# Patient Record
Sex: Female | Born: 2000 | Race: White | Hispanic: No | Smoking: Never smoker
Health system: Southern US, Community
[De-identification: ages and names within clinical notes are randomized; demographics above are authoritative.]

## PROBLEM LIST (undated history)

## (undated) DIAGNOSIS — Z9109 Other allergy status, other than to drugs and biological substances: Secondary | ICD-10-CM

## (undated) DIAGNOSIS — F988 Other specified behavioral and emotional disorders with onset usually occurring in childhood and adolescence: Secondary | ICD-10-CM

## (undated) HISTORY — DX: Other specified behavioral and emotional disorders with onset usually occurring in childhood and adolescence: F98.8

## (undated) HISTORY — DX: Other allergy status, other than to drugs and biological substances: Z91.09

---
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
## 2019-01-01 ENCOUNTER — Ambulatory Visit (INDEPENDENT_AMBULATORY_CARE_PROVIDER_SITE_OTHER): Payer: Medicaid Other | Admitting: Nurse Practitioner

## 2019-01-01 ENCOUNTER — Encounter: Payer: Self-pay | Admitting: Nurse Practitioner

## 2019-01-01 ENCOUNTER — Other Ambulatory Visit: Payer: Self-pay

## 2019-01-01 VITALS — BP 131/77 | HR 94 | Temp 98.0°F | Resp 16 | Ht 62.0 in | Wt 164.0 lb

## 2019-01-01 DIAGNOSIS — Z7251 High risk heterosexual behavior: Secondary | ICD-10-CM

## 2019-01-01 NOTE — Progress Notes (Signed)
   Subjective:    Patient ID: Amber Hurst, female    DOB: [birth year], 18 y.o.   MRN: [id number]   Chief Complaint: Wants STD testing   HPI Patient come sin today wanting STD screening. She states that she recently had a new sex partner and did not use protection. He tested positive gonorrhea but he had slept with someone else several days after he slept with her. Patient denies any symptoms.   Review of Systems  Constitutional: Negative for activity change and appetite change.  HENT: Negative.   Eyes: Negative for pain.  Respiratory: Negative for shortness of breath.   Cardiovascular: Negative for chest pain, palpitations and leg swelling.  Gastrointestinal: Negative for abdominal pain.  Endocrine: Negative for polydipsia.  Genitourinary: Negative.   Skin: Negative for rash.  Neurological: Negative for dizziness, weakness and headaches.  Hematological: Does not bruise/bleed easily.  Psychiatric/Behavioral: Negative.   All other systems reviewed and are negative.      Objective:   Physical Exam Vitals signs and nursing note reviewed.  Constitutional:      General: She is not in acute distress.    Appearance: Normal appearance. She is well-developed.  HENT:     Head: Normocephalic.     Nose: Nose normal.  Eyes:     Pupils: Pupils are equal, round, and reactive to light.  Neck:     Musculoskeletal: Normal range of motion and neck supple.     Vascular: No carotid bruit or JVD.  Cardiovascular:     Rate and Rhythm: Normal rate and regular rhythm.     Heart sounds: Normal heart sounds.  Pulmonary:     Effort: Pulmonary effort is normal. No respiratory distress.     Breath sounds: Normal breath sounds. No wheezing or rales.  Chest:     Chest wall: No tenderness.  Abdominal:     General: Bowel sounds are normal. There is no distension or abdominal bruit.     Palpations: Abdomen is soft. There is no hepatomegaly, splenomegaly, mass or pulsatile mass.     Tenderness: There is  no abdominal tenderness.  Genitourinary:    Comments: No pelvic exam done Musculoskeletal: Normal range of motion.  Lymphadenopathy:     Cervical: No cervical adenopathy.  Skin:    General: Skin is warm and dry.  Neurological:     Mental Status: She is alert and oriented to person, place, and time.     Deep Tendon Reflexes: Reflexes are normal and symmetric.  Psychiatric:        Behavior: Behavior normal.        Thought Content: Thought content normal.        Judgment: Judgment normal.     BP 131/77   Pulse 94   Temp 98 F (36.7 C) (Oral)   Resp 16   Ht 5\' 2"  (1.575 m)   Wt 164 lb (74.4 kg)   SpO2 97%   BMI 30.00 kg/m        Assessment & Plan:  Amber Hurst in today with chief complaint of Wants STD testing   1. High risk heterosexual behavior Safe sex discussed - Chlamydia/Gonococcus/Trichomonas, NAA - STD Screen (8) - HIV antibody (with reflex)  Mary-Margaret Hassell Done, FNP

## 2019-01-01 NOTE — Patient Instructions (Signed)

## 2019-01-02 LAB — STD SCREEN (8)
HIV Screen 4th Generation wRfx: NONREACTIVE
HSV 1 Glycoprotein G Ab, IgG: <0.91 index (ref 0.00–0.90)
HSV 2 IgG, Type Spec: 20.4 index — ABNORMAL HIGH (ref 0.00–0.90)
Hep A IgM: NEGATIVE
Hep B C IgM: NEGATIVE
Hep C Virus Ab: <0.1 s/co ratio (ref 0.0–0.9)
Hepatitis B Surface Ag: NEGATIVE
RPR Ser Ql: NONREACTIVE

## 2019-01-04 LAB — CHLAMYDIA/GONOCOCCUS/TRICHOMONAS, NAA
Chlamydia by NAA: POSITIVE — AB
Gonococcus by NAA: POSITIVE — AB
Trich vag by NAA: NEGATIVE

## 2019-01-05 ENCOUNTER — Ambulatory Visit (INDEPENDENT_AMBULATORY_CARE_PROVIDER_SITE_OTHER): Payer: Medicaid Other

## 2019-01-05 ENCOUNTER — Other Ambulatory Visit: Payer: Self-pay

## 2019-01-05 ENCOUNTER — Other Ambulatory Visit: Payer: Self-pay | Admitting: Nurse Practitioner

## 2019-01-05 DIAGNOSIS — Z7251 High risk heterosexual behavior: Secondary | ICD-10-CM | POA: Diagnosis not present

## 2019-01-05 DIAGNOSIS — A549 Gonococcal infection, unspecified: Secondary | ICD-10-CM

## 2019-01-05 MED ORDER — AZITHROMYCIN 250 MG PO TABS: ORAL_TABLET | ORAL | 0 refills | Status: DC

## 2019-01-05 MED ORDER — CEFTRIAXONE SODIUM 1 G IJ SOLR
1.0000 g | Freq: Once | INTRAMUSCULAR | Status: AC
  Administered 2019-01-05: 11:00:00 1 g via INTRAMUSCULAR

## 2019-01-05 NOTE — Progress Notes (Signed)
Rocephin 1 gram given to right upper outer quadrant.  Patient tolerated well. 

## 2019-01-05 NOTE — Progress Notes (Signed)
zithromax

## 2019-02-11 ENCOUNTER — Other Ambulatory Visit: Payer: Self-pay

## 2019-02-12 ENCOUNTER — Encounter: Payer: Self-pay | Admitting: Nurse Practitioner

## 2019-02-12 ENCOUNTER — Ambulatory Visit (INDEPENDENT_AMBULATORY_CARE_PROVIDER_SITE_OTHER): Payer: Medicaid Other | Admitting: Nurse Practitioner

## 2019-02-12 ENCOUNTER — Other Ambulatory Visit: Payer: Self-pay

## 2019-02-12 VITALS — BP 116/68 | HR 110 | Temp 98.7°F | Ht 62.0 in | Wt 167.0 lb

## 2019-02-12 DIAGNOSIS — Z309 Encounter for contraceptive management, unspecified: Secondary | ICD-10-CM | POA: Diagnosis not present

## 2019-02-12 LAB — PREGNANCY, URINE: Preg Test, Ur: NEGATIVE

## 2019-02-12 MED ORDER — MEDROXYPROGESTERONE ACETATE 150 MG/ML IM SUSP: 150.0000 mg | INTRAMUSCULAR | 11 refills | Status: DC

## 2019-02-12 NOTE — Patient Instructions (Signed)
Medroxyprogesterone injection [Contraceptive] What is this medicine? MEDROXYPROGESTERONE (me DROX ee proe JES te rone) contraceptive injections prevent pregnancy. They provide effective birth control for 3 months. Depo-subQ Provera 104 is also used for treating pain related to endometriosis. This medicine may be used for other purposes; ask your health care provider or pharmacist if you have questions. COMMON BRAND NAME(S): Depo-Provera, Depo-subQ Provera 104 What should I tell my health care provider before I take this medicine? They need to know if you have any of these conditions:  frequently drink alcohol  asthma  blood vessel disease or a history of a blood clot in the lungs or legs  bone disease such as osteoporosis  breast cancer  diabetes  eating disorder (anorexia nervosa or bulimia)  high blood pressure  HIV infection or AIDS  kidney disease  liver disease  mental depression  migraine  seizures (convulsions)  stroke  tobacco smoker  vaginal bleeding  an unusual or allergic reaction to medroxyprogesterone, other hormones, medicines, foods, dyes, or preservatives  pregnant or trying to get pregnant  breast-feeding How should I use this medicine? Depo-Provera Contraceptive injection is given into a muscle. Depo-subQ Provera 104 injection is given under the skin. These injections are given by a health care professional. You must not be pregnant before getting an injection. The injection is usually given during the first 5 days after the start of a menstrual period or 6 weeks after delivery of a baby. Talk to your pediatrician regarding the use of this medicine in children. Special care may be needed. These injections have been used in female children who have started having menstrual periods. Overdosage: If you think you have taken too much of this medicine contact a poison control center or emergency room at once. NOTE: This medicine is only for you. Do not  share this medicine with others. What if I miss a dose? Try not to miss a dose. You must get an injection once every 3 months to maintain birth control. If you cannot keep an appointment, call and reschedule it. If you wait longer than 13 weeks between Depo-Provera contraceptive injections or longer than 14 weeks between Depo-subQ Provera 104 injections, you could get pregnant. Use another method for birth control if you miss your appointment. You may also need a pregnancy test before receiving another injection. What may interact with this medicine? Do not take this medicine with any of the following medications:  bosentan This medicine may also interact with the following medications:  aminoglutethimide  antibiotics or medicines for infections, especially rifampin, rifabutin, rifapentine, and griseofulvin  aprepitant  barbiturate medicines such as phenobarbital or primidone  bexarotene  carbamazepine  medicines for seizures like ethotoin, felbamate, oxcarbazepine, phenytoin, topiramate  modafinil  St. John's wort This list may not describe all possible interactions. Give your health care provider a list of all the medicines, herbs, non-prescription drugs, or dietary supplements you use. Also tell them if you smoke, drink alcohol, or use illegal drugs. Some items may interact with your medicine. What should I watch for while using this medicine? This drug does not protect you against HIV infection (AIDS) or other sexually transmitted diseases. Use of this product may cause you to lose calcium from your bones. Loss of calcium may cause weak bones (osteoporosis). Only use this product for more than 2 years if other forms of birth control are not right for you. The longer you use this product for birth control the more likely you will be at risk   for weak bones. Ask your health care professional how you can keep strong bones. You may have a change in bleeding pattern or irregular periods.  Many females stop having periods while taking this drug. If you have received your injections on time, your chance of being pregnant is very low. If you think you may be pregnant, see your health care professional as soon as possible. Tell your health care professional if you want to get pregnant within the next year. The effect of this medicine may last a long time after you get your last injection. What side effects may I notice from receiving this medicine? Side effects that you should report to your doctor or health care professional as soon as possible:  allergic reactions like skin rash, itching or hives, swelling of the face, lips, or tongue  breast tenderness or discharge  breathing problems  changes in vision  depression  feeling faint or lightheaded, falls  fever  pain in the abdomen, chest, groin, or leg  problems with balance, talking, walking  unusually weak or tired  yellowing of the eyes or skin Side effects that usually do not require medical attention (report to your doctor or health care professional if they continue or are bothersome):  acne  fluid retention and swelling  headache  irregular periods, spotting, or absent periods  temporary pain, itching, or skin reaction at site where injected  weight gain This list may not describe all possible side effects. Call your doctor for medical advice about side effects. You may report side effects to FDA at 1-800-FDA-1088. Where should I keep my medicine? This does not apply. The injection will be given to you by a health care professional. NOTE: This sheet is a summary. It may not cover all possible information. If you have questions about this medicine, talk to your doctor, pharmacist, or health care provider.  2020 Elsevier/Gold Standard (2008-04-08 18:37:56)  

## 2019-02-12 NOTE — Progress Notes (Signed)
   Subjective:    Patient ID: Amber Hurst, female    DOB: 08-19-2000, 18 y.o.   MRN: [id number]  Patient comes in today wanting to go back depoprovera shots. She was on it for 3 years but stopped taking. She is now sexually active. LMP 01/30/19 and was normal. Has no complaints.   Review of Systems  Constitutional: Negative for activity change and appetite change.  HENT: Negative.   Eyes: Negative for pain.  Respiratory: Negative for shortness of breath.   Cardiovascular: Negative for chest pain, palpitations and leg swelling.  Gastrointestinal: Negative for abdominal pain.  Endocrine: Negative for polydipsia.  Genitourinary: Negative.   Skin: Negative for rash.  Neurological: Negative for dizziness, weakness and headaches.  Hematological: Does not bruise/bleed easily.  Psychiatric/Behavioral: Negative.   All other systems reviewed and are negative.      Objective:   Physical Exam Vitals signs and nursing note reviewed.  Constitutional:      General: She is not in acute distress.    Appearance: Normal appearance. She is well-developed.  HENT:     Head: Normocephalic.     Nose: Nose normal.  Eyes:     Pupils: Pupils are equal, round, and reactive to light.  Neck:     Musculoskeletal: Normal range of motion and neck supple.     Vascular: No carotid bruit or JVD.  Cardiovascular:     Rate and Rhythm: Normal rate and regular rhythm.     Heart sounds: Normal heart sounds.  Pulmonary:     Effort: Pulmonary effort is normal. No respiratory distress.     Breath sounds: Normal breath sounds. No wheezing or rales.  Chest:     Chest wall: No tenderness.  Abdominal:     General: Bowel sounds are normal. There is no distension or abdominal bruit.     Palpations: Abdomen is soft. There is no hepatomegaly, splenomegaly, mass or pulsatile mass.     Tenderness: There is no abdominal tenderness.  Musculoskeletal: Normal range of motion.  Lymphadenopathy:     Cervical: No cervical  adenopathy.  Skin:    General: Skin is warm and dry.  Neurological:     Mental Status: She is alert and oriented to person, place, and time.     Deep Tendon Reflexes: Reflexes are normal and symmetric.  Psychiatric:        Behavior: Behavior normal.        Thought Content: Thought content normal.        Judgment: Judgment normal.    BP 116/68   Pulse (!) 110   Temp 98.7 F (37.1 C) (Temporal)   Ht 5\' 2"  (1.575 m)   Wt 167 lb (75.8 kg)   SpO2 99%   BMI 30.54 kg/m   Pregnancy negative       Assessment & Plan:  Amber Hurst in today with chief complaint of Contraception (begin depo provera again)   1. Encounter for contraceptive management, unspecified type Reviewed side effects - Pregnancy, urine - medroxyPROGESTERone (DEPO-PROVERA) 150 MG/ML injection; Inject 1 mL (150 mg total) into the muscle every 3 (three) months.  Dispense: 1 mL; Refill: [place], FNP

## 2019-02-22 ENCOUNTER — Encounter: Payer: Self-pay | Admitting: Family

## 2019-02-22 ENCOUNTER — Ambulatory Visit (INDEPENDENT_AMBULATORY_CARE_PROVIDER_SITE_OTHER): Payer: Medicaid Other | Admitting: Family

## 2019-02-22 ENCOUNTER — Other Ambulatory Visit: Payer: Self-pay

## 2019-02-22 ENCOUNTER — Ambulatory Visit: Payer: Medicaid Other

## 2019-02-22 DIAGNOSIS — J019 Acute sinusitis, unspecified: Secondary | ICD-10-CM | POA: Diagnosis not present

## 2019-02-22 MED ORDER — AMOXICILLIN-POT CLAVULANATE 875-125 MG PO TABS: 1.0000 | ORAL_TABLET | Freq: Two times a day (BID) | ORAL | 0 refills | Status: DC

## 2019-02-22 NOTE — Progress Notes (Signed)
   Virtual Visit via telephone Note Due to COVID-19 pandemic this visit was conducted virtually. This visit type was conducted due to national recommendations for restrictions regarding the COVID-19 Pandemic (e.g. social distancing, sheltering in place) in an effort to limit this patient's exposure and mitigate transmission in our community. All issues noted in this document were discussed and addressed.  A physical exam was not performed with this format.  I connected with Amber Hurst on 02/22/19 at 2:28 pm  by telephone and verified that I am speaking with the correct person using two identifiers. Amber Hurst is currently located in the car and  is currently with no one during visit. The provider, Evelina Dun, FNP is located in their office at time of visit.  I discussed the limitations, risks, security and privacy concerns of performing an evaluation and management service by telephone and the availability of in person appointments. I also discussed with the patient that there may be a patient responsible charge related to this service. The patient expressed understanding and agreed to proceed.   History and Present Illness:  Sinusitis This is a new problem. The current episode started in the past 7 days. The problem has been gradually worsening since onset. There has been no fever. Her pain is at a severity of 9/10. The pain is mild. Associated symptoms include congestion, coughing, headaches, a hoarse voice, sinus pressure and sneezing. Pertinent negatives include no chills, ear pain or sore throat. Past treatments include acetaminophen and oral decongestants. The treatment provided mild relief.      Review of Systems  Constitutional: Negative for chills.  HENT: Positive for congestion, hoarse voice, sinus pressure and sneezing. Negative for ear pain and sore throat.   Respiratory: Positive for cough.   Neurological: Positive for headaches.  All other systems reviewed and are negative.    Observations/Objective: No SOB or distress noted, hoarse voice  Assessment and Plan: 1. Acute sinusitis, recurrence not specified, unspecified location Pt will go get COVID tested tomorrow  Work note given - Take meds as prescribed - Use a cool mist humidifier  -Use saline nose sprays frequently -Force fluids -For any cough or congestion  Use plain Mucinex- regular strength or max strength is fine -For fever or aces or pains- take tylenol or ibuprofen. -Throat lozenges if help -Call if symptoms worsen or do not improve  - amoxicillin-clavulanate (AUGMENTIN) 875-125 MG tablet; Take 1 tablet by mouth 2 (two) times daily.  Dispense: 14 tablet; Refill: 0      I discussed the assessment and treatment plan with the patient. The patient was provided an opportunity to ask questions and all were answered. The patient agreed with the plan and demonstrated an understanding of the instructions.   The patient was advised to call back or seek an in-person evaluation if the symptoms worsen or if the condition fails to improve as anticipated.  The above assessment and management plan was discussed with the patient. The patient verbalized understanding of and has agreed to the management plan. Patient is aware to call the clinic if symptoms persist or worsen. Patient is aware when to return to the clinic for a follow-up visit. Patient educated on when it is appropriate to go to the emergency department.   Time call ended:  2:38 pm  I provided 10 minutes of non-face-to-face time during this encounter.    Evelina Dun, FNP

## 2019-03-02 ENCOUNTER — Other Ambulatory Visit: Payer: Self-pay

## 2019-03-03 ENCOUNTER — Ambulatory Visit (INDEPENDENT_AMBULATORY_CARE_PROVIDER_SITE_OTHER): Payer: Medicaid Other

## 2019-03-03 DIAGNOSIS — Z3042 Encounter for surveillance of injectable contraceptive: Secondary | ICD-10-CM | POA: Diagnosis not present

## 2019-03-03 DIAGNOSIS — Z30013 Encounter for initial prescription of injectable contraceptive: Secondary | ICD-10-CM

## 2019-03-03 NOTE — Progress Notes (Signed)
Medroxyprogesterone injection given to right upper outer quadrant.  Patient tolerated well. 

## 2019-03-07 IMAGING — DX DG KNEE 1-2V*R*
2 series · 2 of 2 positions shown · non-contrast
Comparison: 04/23/2016

CLINICAL DATA: Chronic patellofemoral pain of the right knee.

EXAM:
RIGHT KNEE - 1-2 VIEW

[knee ap]
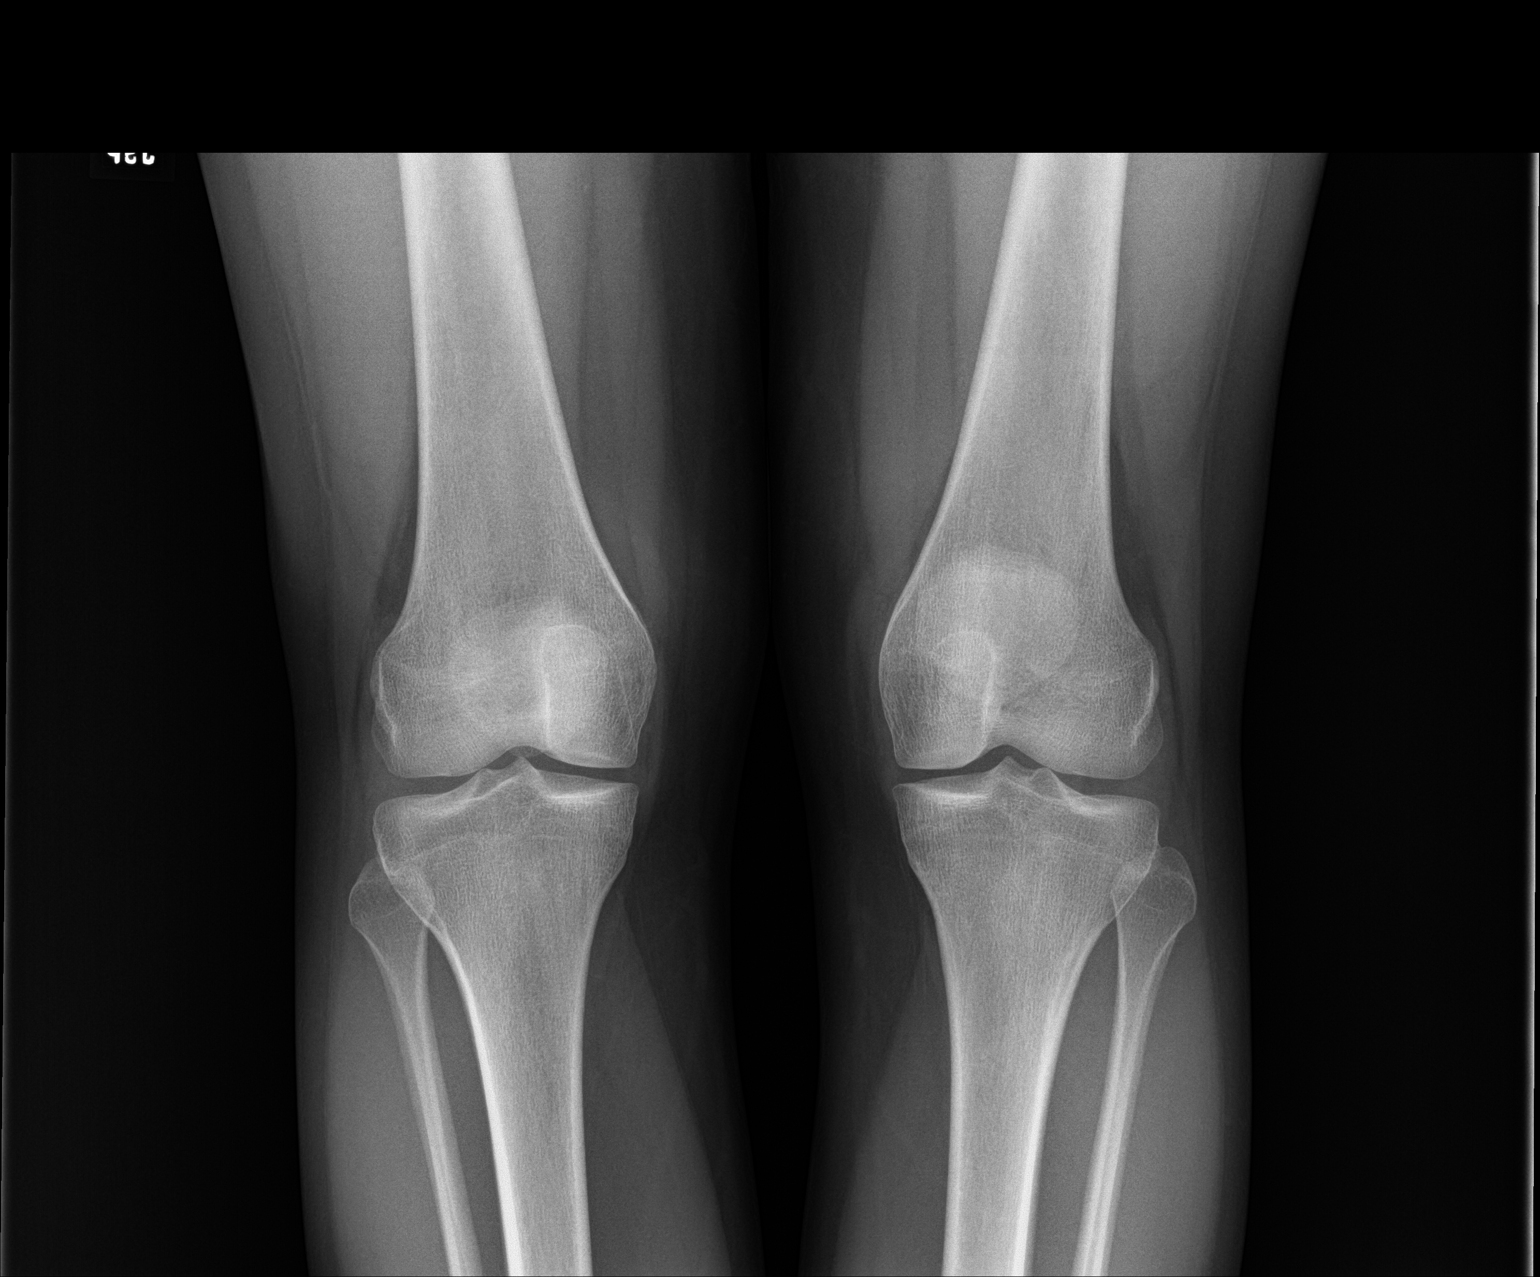

[knee lat]
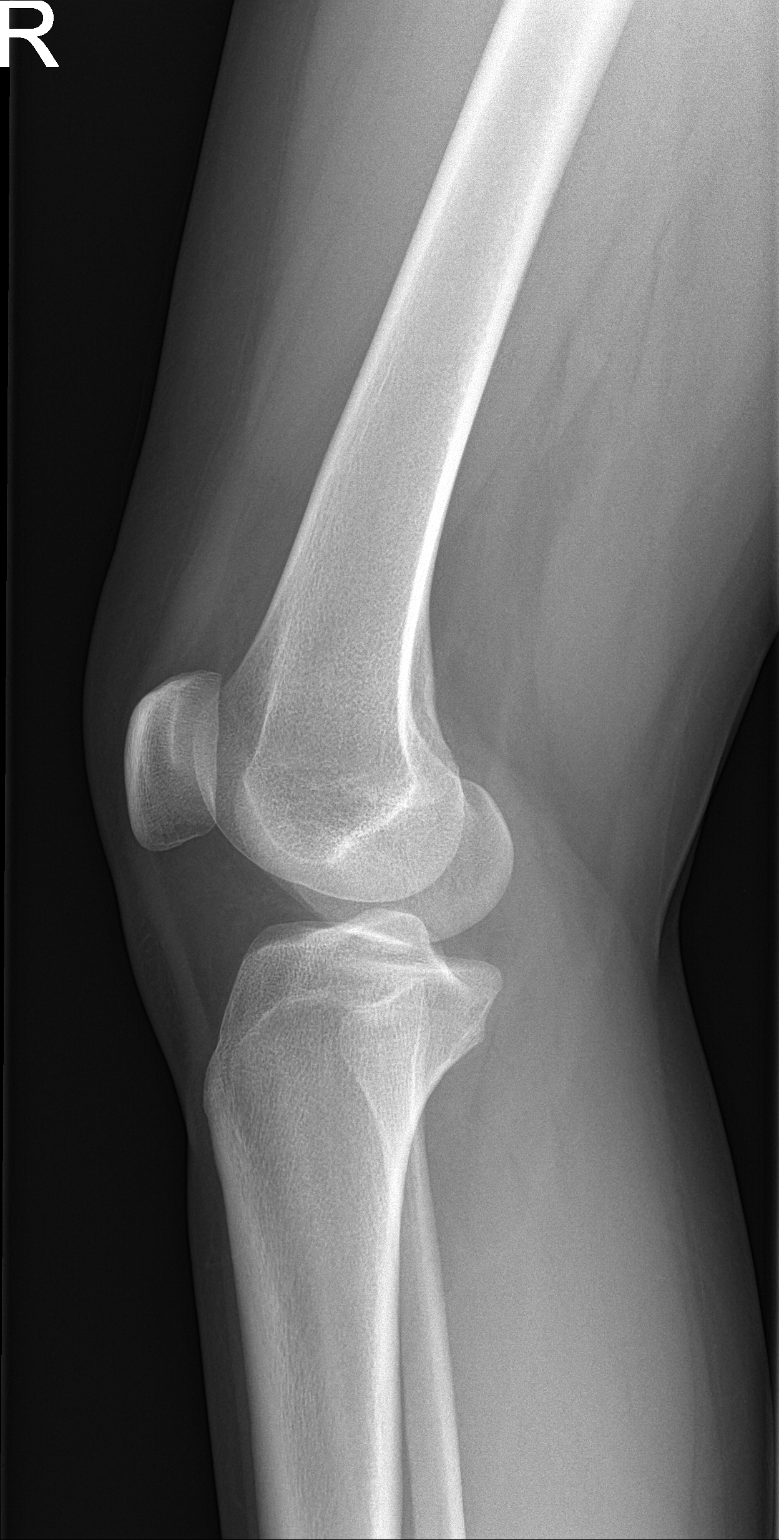

[2 of 2 positions shown; findings below may reference images not displayed]

FINDINGS: No evidence of fracture, dislocation, or joint effusion. No evidence
of arthropathy or other focal bone abnormality. Soft tissues are
unremarkable.
IMPRESSION: Negative.

## 2019-05-20 ENCOUNTER — Ambulatory Visit: Payer: Medicaid Other

## 2019-05-21 ENCOUNTER — Other Ambulatory Visit: Payer: Self-pay

## 2019-05-21 ENCOUNTER — Ambulatory Visit: Payer: Medicaid Other

## 2019-05-24 ENCOUNTER — Ambulatory Visit (INDEPENDENT_AMBULATORY_CARE_PROVIDER_SITE_OTHER): Payer: Medicaid Other

## 2019-05-24 ENCOUNTER — Other Ambulatory Visit: Payer: Self-pay

## 2019-05-24 DIAGNOSIS — Z3042 Encounter for surveillance of injectable contraceptive: Secondary | ICD-10-CM | POA: Diagnosis not present

## 2019-05-24 DIAGNOSIS — Z309 Encounter for contraceptive management, unspecified: Secondary | ICD-10-CM

## 2019-05-24 NOTE — Progress Notes (Signed)
Medroxyprogesterone injection given to left upper outer quadrant.  Patient tolerated well. 

## 2019-08-09 ENCOUNTER — Ambulatory Visit: Payer: Medicaid Other

## 2019-08-10 ENCOUNTER — Telehealth: Payer: Self-pay | Admitting: Nurse Practitioner

## 2019-08-10 ENCOUNTER — Ambulatory Visit: Payer: Medicaid Other

## 2019-08-10 NOTE — Telephone Encounter (Signed)
appt made for 08/18/19

## 2019-08-18 ENCOUNTER — Ambulatory Visit (INDEPENDENT_AMBULATORY_CARE_PROVIDER_SITE_OTHER): Payer: Medicaid Other | Admitting: Family Medicine

## 2019-08-18 ENCOUNTER — Other Ambulatory Visit: Payer: Self-pay

## 2019-08-18 DIAGNOSIS — Z3042 Encounter for surveillance of injectable contraceptive: Secondary | ICD-10-CM | POA: Diagnosis not present

## 2019-08-18 DIAGNOSIS — Z309 Encounter for contraceptive management, unspecified: Secondary | ICD-10-CM

## 2019-08-18 MED ORDER — MEDROXYPROGESTERONE ACETATE 150 MG/ML IM SUSP
150.0000 mg | Freq: Once | INTRAMUSCULAR | Status: AC
  Administered 2019-08-18: 150 mg via INTRAMUSCULAR

## 2019-09-30 DIAGNOSIS — Z419 Encounter for procedure for purposes other than remedying health state, unspecified: Secondary | ICD-10-CM | POA: Diagnosis not present

## 2019-10-31 DIAGNOSIS — Z419 Encounter for procedure for purposes other than remedying health state, unspecified: Secondary | ICD-10-CM | POA: Diagnosis not present

## 2019-11-05 ENCOUNTER — Telehealth: Payer: Self-pay | Admitting: Nurse Practitioner

## 2019-11-05 NOTE — Telephone Encounter (Signed)
Pt needs refill on her depo but pt also states she was told she could administer the Depo Provera on her own at home. I didn't see this documented anywhere in her chart so I wanted to verify this prior to sending rx in to the pharmacy. Please advise.

## 2019-11-05 NOTE — Telephone Encounter (Signed)
No she cannot do depo on her own. Has to be given at doctors office

## 2019-11-05 NOTE — Telephone Encounter (Signed)
  Prescription Request  11/05/2019  What is the name of the medication or equipment? medroxyPROGESTERone (DEPO-PROVERA) 150 MG/ML injection  Have you contacted your pharmacy to request a refill? (if applicable) yes  Which pharmacy would you like this sent to? [facility]   Patient notified that their request is being sent to the clinical staff for review and that they should receive a response within 2 business days.

## 2019-11-08 NOTE — Telephone Encounter (Signed)
Pt aware and scheduled for 11/09/19 at 3:30 with the nurse.

## 2019-11-09 ENCOUNTER — Other Ambulatory Visit: Payer: Self-pay

## 2019-11-09 ENCOUNTER — Ambulatory Visit (INDEPENDENT_AMBULATORY_CARE_PROVIDER_SITE_OTHER): Payer: Medicaid Other

## 2019-11-09 DIAGNOSIS — Z309 Encounter for contraceptive management, unspecified: Secondary | ICD-10-CM

## 2019-11-09 DIAGNOSIS — Z3042 Encounter for surveillance of injectable contraceptive: Secondary | ICD-10-CM

## 2019-11-09 NOTE — Progress Notes (Signed)
Medroxyprogesterone injection given to left upper outer quadrant.  Patient tolerated well. 

## 2019-12-01 DIAGNOSIS — Z419 Encounter for procedure for purposes other than remedying health state, unspecified: Secondary | ICD-10-CM | POA: Diagnosis not present

## 2019-12-31 DIAGNOSIS — Z419 Encounter for procedure for purposes other than remedying health state, unspecified: Secondary | ICD-10-CM | POA: Diagnosis not present

## 2020-01-26 ENCOUNTER — Ambulatory Visit: Payer: Medicaid Other

## 2020-01-31 DIAGNOSIS — Z419 Encounter for procedure for purposes other than remedying health state, unspecified: Secondary | ICD-10-CM | POA: Diagnosis not present

## 2020-02-07 ENCOUNTER — Encounter: Payer: Medicaid Other | Admitting: Nurse Practitioner

## 2020-02-07 ENCOUNTER — Encounter: Payer: Self-pay | Admitting: Nurse Practitioner

## 2020-02-29 ENCOUNTER — Ambulatory Visit
Admission: EM | Admit: 2020-02-29 | Discharge: 2020-02-29 | Disposition: A | Discharge status: Home / Self Care | Payer: Medicaid Other | Attending: Emergency Medicine | Admitting: Emergency Medicine

## 2020-02-29 ENCOUNTER — Other Ambulatory Visit: Payer: Self-pay

## 2020-02-29 ENCOUNTER — Encounter: Payer: Self-pay | Admitting: Emergency Medicine

## 2020-02-29 DIAGNOSIS — J069 Acute upper respiratory infection, unspecified: Secondary | ICD-10-CM

## 2020-02-29 MED ORDER — FLUTICASONE PROPIONATE 50 MCG/ACT NA SUSP: 1.0000 | Freq: Every day | NASAL | 0 refills | Status: DC

## 2020-02-29 MED ORDER — BENZONATATE 100 MG PO CAPS: 100.0000 mg | ORAL_CAPSULE | Freq: Three times a day (TID) | ORAL | 0 refills | Status: DC

## 2020-02-29 MED ORDER — CETIRIZINE HCL 10 MG PO TABS: 10.0000 mg | ORAL_TABLET | Freq: Every day | ORAL | 0 refills | Status: DC

## 2020-02-29 NOTE — ED Provider Notes (Addendum)
[facility]   [id number] 02/29/20 Arrival Time: 1853   CC: COVID symptoms  SUBJECTIVE: History from: patient.  Amber Hurst is a 19 y.o. female who presented to the urgent care for complaint of cough, sneezing and congestion for the past 3 days.  Denies sick exposure to COVID, flu or strep.  Denies recent travel.  Has tried OTC medication without relief.  Denies aggravating factors.  Denies previous symptoms in the past.   Denies fever, chills, fatigue, sinus pain, rhinorrhea, sore throat, SOB, wheezing, chest pain, nausea, changes in bowel or bladder habits.    ROS: As per HPI.  All other pertinent ROS negative.      Past Medical History:  Diagnosis Date  . ADD (attention deficit disorder)   . Environmental allergies    respiratory    History reviewed. No pertinent surgical history. Allergies  Allergen Reactions  . Chocolate Flavor     Rash with itching   Current Facility-Administered Medications on File Prior to Encounter  Medication Dose Route Frequency Provider Last Rate Last Admin  . medroxyPROGESTERone (DEPO-PROVERA) injection 150 mg  150 mg Intramuscular Q90 days Bennie Pierini, FNP   150 mg at 11/09/19 1528   Current Outpatient Medications on File Prior to Encounter  Medication Sig Dispense Refill  . amoxicillin-clavulanate (AUGMENTIN) 875-125 MG tablet Take 1 tablet by mouth 2 (two) times daily. 14 tablet 0  . medroxyPROGESTERone (DEPO-PROVERA) 150 MG/ML injection Inject 1 mL (150 mg total) into the muscle every 3 (three) months. 1 mL 11   Social History   Socioeconomic History  . Marital status: Single    Spouse name: Not on file  . Number of children: Not on file  . Years of education: Not on file  . Highest education level: Not on file  Occupational History  . Not on file  Tobacco Use  . Smoking status: Never Smoker  . Smokeless tobacco: Never Used  Vaping Use  . Vaping Use: Every day  Substance and Sexual Activity  . Alcohol use: No    . Drug use: No  . Sexual activity: Yes  Other Topics Concern  . Not on file  Social History Narrative  . Not on file   Social Determinants of Health   Financial Resource Strain:   . Difficulty of Paying Living Expenses: Not on file  Food Insecurity:   . Worried About Programme researcher, broadcasting/film/video in the Last Year: Not on file  . Ran Out of Food in the Last Year: Not on file  Transportation Needs:   . Lack of Transportation (Medical): Not on file  . Lack of Transportation (Non-Medical): Not on file  Physical Activity:   . Days of Exercise per Week: Not on file  . Minutes of Exercise per Session: Not on file  Stress:   . Feeling of Stress : Not on file  Social Connections:   . Frequency of Communication with Friends and Family: Not on file  . Frequency of Social Gatherings with Friends and Family: Not on file  . Attends Religious Services: Not on file  . Active Member of Clubs or Organizations: Not on file  . Attends Banker Meetings: Not on file  . Marital Status: Not on file  Intimate Partner Violence:   . Fear of Current or Ex-Partner: Not on file  . Emotionally Abused: Not on file  . Physically Abused: Not on file  . Sexually Abused: Not on file   No family history on file.  OBJECTIVE:  Vitals:   02/29/20 1908 02/29/20 1909  BP:  116/75  Pulse:  (!) 101  Resp:  19  Temp:  98.4 F (36.9 C)  TempSrc:  Oral  SpO2:  98%  Weight: 178 lb (80.7 kg)   Height: 5\' 2"  (1.575 m)      General appearance: alert; appears fatigued, but nontoxic; speaking in full sentences and tolerating own secretions HEENT: NCAT; Ears: EACs clear, TMs pearly gray; Eyes: PERRL.  EOM grossly intact. Sinuses: nontender; Nose: nares patent without rhinorrhea, Throat: oropharynx clear, tonsils non erythematous or enlarged, uvula midline  Neck: supple without LAD Lungs: unlabored respirations, symmetrical air entry; cough: moderate; no respiratory distress; CTAB Heart: regular rate and  rhythm.  Radial pulses 2+ symmetrical bilaterally Skin: warm and dry Psychological: alert and cooperative; normal mood and affect  LABS:  No results found for this or any previous visit (from the past 24 hour(s)).   ASSESSMENT & PLAN:  1. URI with cough and congestion     Meds ordered this encounter  Medications  . fluticasone (FLONASE) 50 MCG/ACT nasal spray    Sig: Place 1 spray into both nostrils daily for 14 days.    Dispense:  16 g    Refill:  0  . cetirizine (ZYRTEC ALLERGY) 10 MG tablet    Sig: Take 1 tablet (10 mg total) by mouth daily.    Dispense:  30 tablet    Refill:  0  . benzonatate (TESSALON) 100 MG capsule    Sig: Take 1 capsule (100 mg total) by mouth every 8 (eight) hours.    Dispense:  30 capsule    Refill:  0    She declined COVID-19 test  Discharge Instructions  Get plenty of rest and push fluids Tessalon Perles prescribed for cough Zyrtec for nasal congestion, runny nose, and/or sore throat Flonase for nasal congestion and runny nose Use medications daily for symptom relief Use OTC medications like ibuprofen or tylenol as needed fever or pain Call or go to the ED if you have any new or worsening symptoms such as fever, worsening cough, shortness of breath, chest tightness, chest pain, turning blue, changes in mental status, etc...   Reviewed expectations re: course of current medical issues. Questions answered. Outlined signs and symptoms indicating need for more acute intervention. Patient verbalized understanding. After Visit Summary given.         , FNP 02/29/20 1920    03/02/20, FNP 02/29/20 1922

## 2020-02-29 NOTE — ED Triage Notes (Addendum)
Sneezing, ears are itching and cough since Sunday.  Pt does not want to be covid tested. States she needs a note for work because she had to leave early today.  She is still under her 90 day probation period at her job.

## 2020-02-29 NOTE — Discharge Instructions (Addendum)
Get plenty of rest and push fluids Tessalon Perles prescribed for cough Zyrtec for nasal congestion, runny nose, and/or sore throat Flonase for nasal congestion and runny nose Use medications daily for symptom relief Use OTC medications like ibuprofen or tylenol as needed fever or pain Call or go to the ED if you have any new or worsening symptoms such as fever, worsening cough, shortness of breath, chest tightness, chest pain, turning blue, changes in mental status, etc...  

## 2020-03-01 ENCOUNTER — Ambulatory Visit: Payer: Medicaid Other

## 2020-03-01 DIAGNOSIS — Z419 Encounter for procedure for purposes other than remedying health state, unspecified: Secondary | ICD-10-CM | POA: Diagnosis not present

## 2020-04-01 DIAGNOSIS — Z419 Encounter for procedure for purposes other than remedying health state, unspecified: Secondary | ICD-10-CM | POA: Diagnosis not present

## 2020-05-02 DIAGNOSIS — Z419 Encounter for procedure for purposes other than remedying health state, unspecified: Secondary | ICD-10-CM | POA: Diagnosis not present

## 2020-05-16 ENCOUNTER — Ambulatory Visit (INDEPENDENT_AMBULATORY_CARE_PROVIDER_SITE_OTHER): Payer: Medicaid Other | Admitting: Family Medicine

## 2020-05-16 ENCOUNTER — Encounter: Payer: Self-pay | Admitting: Family Medicine

## 2020-05-16 DIAGNOSIS — R300 Dysuria: Secondary | ICD-10-CM

## 2020-05-16 LAB — URINALYSIS
Bilirubin, UA: NEGATIVE
Glucose, UA: NEGATIVE
Ketones, UA: NEGATIVE
Nitrite, UA: NEGATIVE
Protein,UA: NEGATIVE
Specific Gravity, UA: 1.015 (ref 1.005–1.030)
Urobilinogen, Ur: 1 mg/dL (ref 0.2–1.0)
pH, UA: 7 (ref 5.0–7.5)

## 2020-05-16 MED ORDER — NITROFURANTOIN MONOHYD MACRO 100 MG PO CAPS: 100.0000 mg | ORAL_CAPSULE | Freq: Two times a day (BID) | ORAL | 0 refills | Status: DC

## 2020-05-16 NOTE — Progress Notes (Signed)
    Subjective:    Patient ID: Amber Hurst, female    DOB: [birth year], 20 y.o.   MRN: [id number]   HPI: Amber Hurst is a 20 y.o. female presenting for throbbing pain at bladder. Has to push to pee. Onset 2 weeks ago. No flank pain. Feels like she has to push afterward. No fever. No NVD    Depression screen [facility] 2/9 02/12/2019 02/17/2018 12/09/2017 11/20/2017 09/22/2017  Decreased Interest 0 0 0 0 0  Down, Depressed, Hopeless 0 0 0 0 0  PHQ - 2 Score 0 0 0 0 0  Altered sleeping - - - - -  Tired, decreased energy - - - - -  Change in appetite - - - - -  Feeling bad or failure about yourself  - - - - -  Trouble concentrating - - - - -  Moving slowly or fidgety/restless - - - - -  Suicidal thoughts - - - - -  PHQ-9 Score - - - - -     Relevant past medical, surgical, family and social history reviewed and updated as indicated.  Interim medical history since our last visit reviewed. Allergies and medications reviewed and updated.  ROS:  Review of Systems  Noncontributory except as noted in HPI.  Social History   Tobacco Use  Smoking Status Never Smoker  Smokeless Tobacco Never Used       Objective:     Wt Readings from Last 3 Encounters:  02/29/20 178 lb (80.7 kg)  02/12/19 167 lb (75.8 kg) (91 %, Z= 1.33)*  01/01/19 164 lb (74.4 kg) (90 %, Z= 1.27)*   * Growth percentiles are based on CDC (Girls, 2-20 Years) data.     Exam deferred. Pt. Harboring due to COVID 19. Phone visit performed.   Assessment & Plan:   1. Dysuria     Meds ordered this encounter  Medications  . nitrofurantoin, macrocrystal-monohydrate, (MACROBID) 100 MG capsule    Sig: Take 1 capsule (100 mg total) by mouth 2 (two) times daily.    Dispense:  14 capsule    Refill:  0    Orders Placed This Encounter  Procedures  . Urine Culture  . Urinalysis      Diagnoses and all orders for this visit:  Dysuria -     Urinalysis -     Urine Culture  Other orders -     nitrofurantoin,  macrocrystal-monohydrate, (MACROBID) 100 MG capsule; Take 1 capsule (100 mg total) by mouth 2 (two) times daily.    Virtual Visit via telephone Note  I discussed the limitations, risks, security and privacy concerns of performing an evaluation and management service by telephone and the availability of in person appointments. The patient was identified with two identifiers. Pt.expressed understanding and agreed to proceed. Pt. Is at home. Dr. Darlyn Read is in his office.  Follow Up Instructions:   I discussed the assessment and treatment plan with the patient. The patient was provided an opportunity to ask questions and all were answered. The patient agreed with the plan and demonstrated an understanding of the instructions.   The patient was advised to call back or seek an in-person evaluation if the symptoms worsen or if the condition fails to improve as anticipated.   Total minutes including chart review and phone contact time: 12   Follow up plan: No follow-ups on file.  Mechele Claude, MD [facility]

## 2020-05-18 LAB — URINE CULTURE

## 2020-05-21 NOTE — Progress Notes (Signed)
Hello Nisha,  Your lab result is normal and/or stable.Some minor variations that are not significant are commonly marked abnormal, but do not represent any medical problem for you.  Best regards, Mechele Claude, M.D.

## 2020-05-22 ENCOUNTER — Telehealth: Payer: Self-pay

## 2020-05-23 NOTE — Telephone Encounter (Signed)
Patient aware that urine culture results were normal.

## 2020-05-30 DIAGNOSIS — Z419 Encounter for procedure for purposes other than remedying health state, unspecified: Secondary | ICD-10-CM | POA: Diagnosis not present

## 2020-06-06 ENCOUNTER — Encounter: Payer: Self-pay | Admitting: Adult Health

## 2020-06-06 ENCOUNTER — Other Ambulatory Visit: Payer: Self-pay

## 2020-06-06 ENCOUNTER — Ambulatory Visit (INDEPENDENT_AMBULATORY_CARE_PROVIDER_SITE_OTHER): Payer: Medicaid Other | Admitting: Adult Health

## 2020-06-06 VITALS — BP 107/66 | HR 85 | Ht 62.0 in | Wt 190.0 lb

## 2020-06-06 DIAGNOSIS — N926 Irregular menstruation, unspecified: Secondary | ICD-10-CM | POA: Diagnosis not present

## 2020-06-06 DIAGNOSIS — N644 Mastodynia: Secondary | ICD-10-CM | POA: Diagnosis not present

## 2020-06-06 NOTE — Progress Notes (Signed)
  Subjective:     Patient ID: Amber Hurst, female   DOB: 09/18/2000, 20 y.o.   MRN: [id number]  HPI Amber Hurst is a 20 year old white female,single, G0P0, in complaining of breast tenderness for 2 weeks. Recently treated for UTI. Stopped depo in October, no period since and negative HPTs. PCP is Bennie Pierini.  Review of Systems +breast tenderness x 2 weeks  Reviewed past medical,surgical, social and family history. Reviewed medications and allergies.     Objective:   Physical Exam BP 107/66 (BP Location: Left Arm, Patient Position: Sitting, Cuff Size: Normal)   Pulse 85   Ht 5\' 2"  (1.575 m)   Wt 190 lb (86.2 kg)   BMI 34.75 kg/m   She could not pee for UPT. Skin warm and dry. Neck: mid line trachea, normal thyroid, good ROM, no lymphadenopathy noted. Lungs: clear to ausculation bilaterally. Cardiovascular: regular rate and rhythm.   Breasts:no dominate palpable mass, retraction or nipple discharge No point tenderness on sternum or ribs AA is 2 Fall risk is low PHQ 9 score is 0 GAD 7 score is 2.  [facility] - 06/06/20 1117      Pregnancy Intention Screening   Does the patient want to become pregnant in the next year? No    Does the patient's partner want to become pregnant in the next year? No    Would the patient like to discuss contraceptive options today? No      Contraception Wrap Up   Current Method No Contraceptive Precautions    End Method No Contraception Precautions    Contraception Counseling Provided No         Co exam with 08/06/20, NP student.    Assessment:     1. Breast tenderness Decrease caffeine Do not wear under wire every day  2. Missed periods Take OTC PNV or 800 mcg folic acid She is aware of Plan B Call if no period by end of April She declines birth control or QHCG today    Plan:     Return in 3 months for first pap and physical

## 2020-06-22 ENCOUNTER — Ambulatory Visit: Payer: Medicaid Other | Admitting: Nurse Practitioner

## 2020-06-28 ENCOUNTER — Encounter: Payer: Self-pay | Admitting: Adult Health

## 2020-06-30 DIAGNOSIS — Z419 Encounter for procedure for purposes other than remedying health state, unspecified: Secondary | ICD-10-CM | POA: Diagnosis not present

## 2020-07-30 DIAGNOSIS — Z419 Encounter for procedure for purposes other than remedying health state, unspecified: Secondary | ICD-10-CM | POA: Diagnosis not present

## 2020-08-16 ENCOUNTER — Ambulatory Visit (INDEPENDENT_AMBULATORY_CARE_PROVIDER_SITE_OTHER): Payer: Medicaid Other | Admitting: Family Medicine

## 2020-08-16 ENCOUNTER — Encounter: Payer: Self-pay | Admitting: Family Medicine

## 2020-08-16 DIAGNOSIS — J029 Acute pharyngitis, unspecified: Secondary | ICD-10-CM

## 2020-08-16 NOTE — Progress Notes (Signed)
   Virtual Visit  Note Due to COVID-19 pandemic this visit was conducted virtually. This visit type was conducted due to national recommendations for restrictions regarding the COVID-19 Pandemic (e.g. social distancing, sheltering in place) in an effort to limit this patient's exposure and mitigate transmission in our community. All issues noted in this document were discussed and addressed.  A physical exam was not performed with this format.  I connected with Amber Hurst on 08/16/20 at 1355 by telephone and verified that I am speaking with the correct person using two identifiers. Amber Hurst is currently located at work and no one is currently with her during the visit. The provider, Gabriel Earing, FNP is located in their office at time of visit.  I discussed the limitations, risks, security and privacy concerns of performing an evaluation and management service by telephone and the availability of in person appointments. I also discussed with the patient that there may be a patient responsible charge related to this service. The patient expressed understanding and agreed to proceed.  CC: sore throat  History and Present Illness:  HPI  [name] reports a sore throat x 1 day. She reports pain with swallowing. Her tonsils also look red and swollen. Denies exudate. Reports tender lymph nodes on the left side of her neck. She has been feeling the need to clear her throat frequently today. She has been dealing with seasonal allergies and just recovered from a sinus infection. She does have some postnasal drip. She take an allergy pill and flonase. She denies fever, congestion, shortness of breath, chest pain, nausea, vomiting, or diarrhea.   ROS As per HPI.   Observations/Objective: Alert and oriented x 3. Able to speak in full sentences without difficulty.   Assessment and Plan: [name] was seen today for sore throat.  Diagnoses and all orders for this visit:  Sore throat Patient will come by  for testing as below. Quarantine until [employer]. Will notify patient of results. Discussed symptomatic care for now: hydration, tylenol, chloraseptic spray, throat lozenges, and soft foods. Return to office for new or worsening symptoms, or if symptoms persist.  -     Veritor Flu A/B Waived -     Novel Coronavirus, NAA ([facility]); Future -     Rapid Strep Screen ([facility] ONLY)     Follow Up Instructions: As needed.     I discussed the assessment and treatment plan with the patient. The patient was provided an opportunity to ask questions and all were answered. The patient agreed with the plan and demonstrated an understanding of the instructions.   The patient was advised to call back or seek an in-person evaluation if the symptoms worsen or if the condition fails to improve as anticipated.  The above assessment and management plan was discussed with the patient. The patient verbalized understanding of and has agreed to the management plan. Patient is aware to call the clinic if symptoms persist or worsen. Patient is aware when to return to the clinic for a follow-up visit. Patient educated on when it is appropriate to go to the emergency department.   Time call ended: 1407    I provided 12 minutes of  non face-to-face time during this encounter.    Gabriel Earing, FNP

## 2020-08-17 DIAGNOSIS — J029 Acute pharyngitis, unspecified: Secondary | ICD-10-CM | POA: Diagnosis not present

## 2020-08-17 LAB — CULTURE, GROUP A STREP

## 2020-08-17 LAB — VERITOR FLU A/B WAIVED
Influenza A: NEGATIVE
Influenza B: NEGATIVE

## 2020-08-17 LAB — RAPID STREP SCREEN (MED CTR MEBANE ONLY): Strep Gp A Ag, IA W/Reflex: NEGATIVE

## 2020-08-17 NOTE — Addendum Note (Signed)
Addended by: Margorie John on: 08/17/2020 09:11 AM   Modules accepted: Orders

## 2020-08-18 LAB — NOVEL CORONAVIRUS, NAA: SARS-CoV-2, NAA: NOT DETECTED

## 2020-08-18 LAB — SARS-COV-2, NAA 2 DAY TAT

## 2020-08-30 DIAGNOSIS — Z419 Encounter for procedure for purposes other than remedying health state, unspecified: Secondary | ICD-10-CM | POA: Diagnosis not present

## 2020-09-08 ENCOUNTER — Other Ambulatory Visit: Payer: Medicaid Other | Admitting: Adult Health

## 2020-09-29 DIAGNOSIS — Z419 Encounter for procedure for purposes other than remedying health state, unspecified: Secondary | ICD-10-CM | POA: Diagnosis not present

## 2020-10-30 DIAGNOSIS — Z419 Encounter for procedure for purposes other than remedying health state, unspecified: Secondary | ICD-10-CM | POA: Diagnosis not present

## 2020-11-02 DIAGNOSIS — H9203 Otalgia, bilateral: Secondary | ICD-10-CM | POA: Diagnosis not present

## 2020-11-02 DIAGNOSIS — H6693 Otitis media, unspecified, bilateral: Secondary | ICD-10-CM | POA: Diagnosis not present

## 2020-11-02 DIAGNOSIS — H6690 Otitis media, unspecified, unspecified ear: Secondary | ICD-10-CM | POA: Diagnosis not present

## 2020-11-03 ENCOUNTER — Telehealth: Payer: Self-pay

## 2020-11-03 NOTE — Telephone Encounter (Signed)
Transition Care Management Follow-up Telephone Call Date of discharge and from where: 11/02/2020-[facility] How have you been since you were released from the hospital? Patient stated that she feels a little better.  Any questions or concerns? No  Items Reviewed: Did the pt receive and understand the discharge instructions provided? Yes  Medications obtained and verified? Yes  Other? No  Any new allergies since your discharge? No  Dietary orders reviewed? N/A Do you have support at home? Yes   Home Care and Equipment/Supplies: Were home health services ordered? not applicable If so, what is the name of the agency? N/A  Has the agency set up a time to come to the patient's home? not applicable Were any new equipment or medical supplies ordered?  No What is the name of the medical supply agency? N/A Were you able to get the supplies/equipment? not applicable Do you have any questions related to the use of the equipment or supplies? No  Functional Questionnaire: (I = Independent and D = Dependent) ADLs: I  Bathing/Dressing- I  Meal Prep- I  Eating- I  Maintaining continence- I  Transferring/Ambulation- I  Managing Meds- I  Follow up appointments reviewed:  PCP Hospital f/u appt confirmed? No   Specialist Hospital f/u appt confirmed? No   Are transportation arrangements needed? No  If their condition worsens, is the pt aware to call PCP or go to the Emergency Dept.? Yes Was the patient provided with contact information for the PCP's office or ED? Yes Was to pt encouraged to call back with questions or concerns? Yes

## 2020-11-13 ENCOUNTER — Ambulatory Visit (INDEPENDENT_AMBULATORY_CARE_PROVIDER_SITE_OTHER): Payer: Medicaid Other | Admitting: Family Medicine

## 2020-11-13 ENCOUNTER — Other Ambulatory Visit: Payer: Self-pay | Admitting: Family

## 2020-11-13 DIAGNOSIS — F410 Panic disorder [episodic paroxysmal anxiety] without agoraphobia: Secondary | ICD-10-CM | POA: Diagnosis not present

## 2020-11-13 DIAGNOSIS — R000 Tachycardia, unspecified: Secondary | ICD-10-CM

## 2020-11-13 MED ORDER — HYDROXYZINE HCL 10 MG PO TABS: 10.0000 mg | ORAL_TABLET | Freq: Three times a day (TID) | ORAL | 0 refills | Status: DC | PRN

## 2020-11-13 NOTE — Progress Notes (Signed)
   Virtual Visit  Note Due to COVID-19 pandemic this visit was conducted virtually. This visit type was conducted due to national recommendations for restrictions regarding the COVID-19 Pandemic (e.g. social distancing, sheltering in place) in an effort to limit this patient's exposure and mitigate transmission in our community. All issues noted in this document were discussed and addressed.  A physical exam was not performed with this format.  I connected with Amber Hurst on 11/13/20 at 1615 by telephone and verified that I am speaking with the correct person using two identifiers. Amber Hurst is currently located at work and no one is currently with her during the visit. The provider, Gabriel Earing, FNP is located in their office at time of visit.  I discussed the limitations, risks, security and privacy concerns of performing an evaluation and management service by telephone and the availability of in person appointments. I also discussed with the patient that there may be a patient responsible charge related to this service. The patient expressed understanding and agreed to proceed.  CC: panic attack  History and Present Illness:  HPI Amber Hurst reports panic attacks for the last 3 days. She reports intermittent episodes of elevated HR, chest pain, and shortness of breath. She reports a history of anxiety and panic attacks. She reports that these episodes feel like her previous panic attacks. She denies dizziness, edema, changes in vision, or focal weakness. The does not take medication for her anxiety.     ROS Negative unless specially indicated above in HPI.  Observations/Objective: Alert and oriented x 3. Able to speak in full sentences without difficulty.   Assessment and Plan: Amber Hurst was seen today for panic attack.  Diagnoses and all orders for this visit:  Panic attack Atarax as below. Discussed that she needs to be evaluated in person if symptoms persist or worsen.  -      hydrOXYzine (ATARAX/VISTARIL) 10 MG tablet; Take 1 tablet (10 mg total) by mouth 3 (three) times daily as needed for anxiety.    Follow Up Instructions: Return to office for new or worsening symptoms, or if symptoms persist.     I discussed the assessment and treatment plan with the patient. The patient was provided an opportunity to ask questions and all were answered. The patient agreed with the plan and demonstrated an understanding of the instructions.   The patient was advised to call back or seek an in-person evaluation if the symptoms worsen or if the condition fails to improve as anticipated.  The above assessment and management plan was discussed with the patient. The patient verbalized understanding of and has agreed to the management plan. Patient is aware to call the clinic if symptoms persist or worsen. Patient is aware when to return to the clinic for a follow-up visit. Patient educated on when it is appropriate to go to the emergency department.   Time call ended: 1628   I provided 13 minutes of  non face-to-face time during this encounter.    Gabriel Earing, FNP

## 2020-11-14 ENCOUNTER — Encounter: Payer: Self-pay | Admitting: Family Medicine

## 2020-11-29 ENCOUNTER — Other Ambulatory Visit: Payer: Self-pay

## 2020-11-29 ENCOUNTER — Ambulatory Visit (INDEPENDENT_AMBULATORY_CARE_PROVIDER_SITE_OTHER): Payer: Medicaid Other | Admitting: Family Medicine

## 2020-11-29 ENCOUNTER — Encounter: Payer: Self-pay | Admitting: Family Medicine

## 2020-11-29 VITALS — BP 134/79 | HR 82 | Temp 98.2°F | Ht 62.0 in | Wt 190.0 lb

## 2020-11-29 DIAGNOSIS — F320 Major depressive disorder, single episode, mild: Secondary | ICD-10-CM | POA: Diagnosis not present

## 2020-11-29 DIAGNOSIS — F411 Generalized anxiety disorder: Secondary | ICD-10-CM

## 2020-11-29 DIAGNOSIS — F32A Depression, unspecified: Secondary | ICD-10-CM

## 2020-11-29 MED ORDER — PROPRANOLOL HCL 10 MG PO TABS: 10.0000 mg | ORAL_TABLET | Freq: Two times a day (BID) | ORAL | 1 refills | Status: DC | PRN

## 2020-11-29 MED ORDER — CITALOPRAM HYDROBROMIDE 10 MG PO TABS: 10.0000 mg | ORAL_TABLET | Freq: Every day | ORAL | 3 refills | Status: DC

## 2020-11-29 NOTE — Progress Notes (Signed)
Established Patient Office Visit  Subjective:  Patient ID: Amber Hurst, female    DOB: [birth year]  Age: 20 y.o. MRN: [id number]  CC:  Chief Complaint  Patient presents with   Anxiety    HPI Amber Hurst presents for follow up of anxiety. She has taken hydroxyzine 4x but reports that this makes her feel tired. She reports panic attacks 1-2x a day. Usually lasts for 30 minutes. She reports anxiety, nervousness, and increased heart rate. Also reports some chest pain, shortness of breath at times. Sometimes feels shaky. Usually improves with breathing exercises. Denies visual disturbances, edema, dizziness, or palpitations. She reports history of anxiety and has tried to avoid taking medication for this.   GAD 7 : Generalized Anxiety Score 11/29/2020 06/06/2020  Nervous, Anxious, on Edge 2 1  Control/stop worrying 0 0  Worry too much - different things 1 0  Trouble relaxing 0 0  Restless 0 0  Easily annoyed or irritable 1 1  Afraid - awful might happen 0 0  Total GAD 7 Score 4 2  Anxiety Difficulty Not difficult at all -    Depression screen [facility] 2/9 11/29/2020 06/06/2020 02/12/2019  Decreased Interest 1 0 0  Down, Depressed, Hopeless 1 0 0  PHQ - 2 Score 2 0 0  Altered sleeping 0 0 -  Tired, decreased energy 1 0 -  Change in appetite 1 0 -  Feeling bad or failure about yourself  0 0 -  Trouble concentrating 0 0 -  Moving slowly or fidgety/restless 1 0 -  Suicidal thoughts 0 0 -  PHQ-9 Score 5 0 -  Difficult doing work/chores Not difficult at all - -  Some recent data might be hidden       Past Medical History:  Diagnosis Date   ADD (attention deficit disorder)    Environmental allergies    respiratory     No past surgical history on file.  Family History  Problem Relation Age of Onset   Hypertension Father     Social History   Socioeconomic History   Marital status: Single    Spouse name: Not on file   Number of children: Not on file   Years of education: Not on  file   Highest education level: Not on file  Occupational History   Not on file  Tobacco Use   Smoking status: Never   Smokeless tobacco: Never  Vaping Use   Vaping Use: Every day  Substance and Sexual Activity   Alcohol use: Yes   Drug use: No   Sexual activity: Yes    Birth control/protection: None  Other Topics Concern   Not on file  Social History Narrative   Not on file   Social Determinants of Health   Financial Resource Strain: Low Risk    Difficulty of Paying Living Expenses: Not very hard  Food Insecurity: No Food Insecurity   Worried About Running Out of Food in the Last Year: Never true   Ran Out of Food in the Last Year: Never true  Transportation Needs: No Transportation Needs   Lack of Transportation (Medical): No   Lack of Transportation (Non-Medical): No  Physical Activity: Insufficiently Active   Days of Exercise per Week: 2 days   Minutes of Exercise per Session: 10 min  Stress: No Stress Concern Present   Feeling of Stress : Only a little  Social Connections: Socially Isolated   Frequency of Communication with Friends and Family: Once a week  Frequency of Social Gatherings with Friends and Family: Once a week   Attends Religious Services: Never   Database administrator or Organizations: No   Attends Engineer, structural: Never   Marital Status: Never married  Catering manager Violence: Not At Risk   Fear of Current or Ex-Partner: No   Emotionally Abused: No   Physically Abused: No   Sexually Abused: No    Outpatient Medications Prior to Visit  Medication Sig Dispense Refill   hydrOXYzine (ATARAX/VISTARIL) 10 MG tablet Take 1 tablet (10 mg total) by mouth 3 (three) times daily as needed for anxiety. 90 tablet 0   No facility-administered medications prior to visit.    Allergies  Allergen Reactions   Chocolate Flavor     Rash with itching    ROS Review of Systems As per HPI.    Objective:    Physical Exam Vitals and  nursing note reviewed.  Constitutional:      General: She is not in acute distress.    Appearance: She is not ill-appearing, toxic-appearing or diaphoretic.  Neck:     Vascular: No carotid bruit.  Cardiovascular:     Rate and Rhythm: Normal rate and regular rhythm.     Heart sounds: Normal heart sounds. No murmur heard. Pulmonary:     Effort: No respiratory distress.     Breath sounds: Normal breath sounds. No wheezing, rhonchi or rales.  Musculoskeletal:     Cervical back: No tenderness.     Right lower leg: No edema.     Left lower leg: No edema.  Skin:    General: Skin is warm and dry.  Neurological:     General: No focal deficit present.     Mental Status: She is alert and oriented to person, place, and time.  Psychiatric:        Mood and Affect: Mood is anxious.        Speech: Speech normal.        Behavior: Behavior normal.    BP 134/79   Pulse 82   Temp 98.2 F (36.8 C) (Temporal)   Ht 5\' 2"  (1.575 m)   Wt 190 lb (86.2 kg)   BMI 34.75 kg/m  Wt Readings from Last 3 Encounters:  11/29/20 190 lb (86.2 kg)  06/06/20 190 lb (86.2 kg)  02/29/20 178 lb (80.7 kg)     Health Maintenance Due  Topic Date Due   COVID-19 Vaccine (1) Never done   HPV VACCINES (1 - 2-dose series) Never done   CHLAMYDIA SCREENING  01/01/2020   PAP-Cervical Cytology Screening  Never done   PAP SMEAR-Modifier  Never done   TETANUS/TDAP  11/05/2020   INFLUENZA VACCINE  10/30/2020       Topic Date Due   HPV VACCINES (1 - 2-dose series) Never done    No results found for: TSH Lab Results  Component Value Date   WBC 10.9 (H) 12/09/2017   HGB 13.4 12/09/2017   HCT 40.1 12/09/2017   MCV 91 12/09/2017   PLT 350 12/09/2017   Lab Results  Component Value Date   NA 135 03/12/2016   K 3.8 03/12/2016   CO2 22 03/12/2016   GLUCOSE 89 03/12/2016   BUN 11 03/12/2016   CREATININE 0.61 03/12/2016   BILITOT 0.5 03/12/2016   ALKPHOS 84 03/12/2016   AST 24 03/12/2016   ALT 17 03/12/2016    PROT 6.9 03/12/2016   ALBUMIN 4.1 03/12/2016   CALCIUM 8.8 (L) 03/12/2016  ANIONGAP 7 03/12/2016   No results found for: CHOL No results found for: HDL No results found for: LDLCALC No results found for: TRIG No results found for: CHOLHDL No results found for: WIOX7D    Assessment & Plan:   Dakari was seen today for anxiety.  Diagnoses and all orders for this visit:  Mild episode of depression (HCC) Denies SI. Start celexa as below.  -     citalopram (CELEXA) 10 MG tablet; Take 1 tablet (10 mg total) by mouth daily.  Generalized anxiety disorder With panic attacks. Start celexa. Failed hydroxyzine. Try propranolol. Handout given.  -     citalopram (CELEXA) 10 MG tablet; Take 1 tablet (10 mg total) by mouth daily. -     propranolol (INDERAL) 10 MG tablet; Take 1 tablet (10 mg total) by mouth 2 (two) times daily as needed (anxiety).   Follow-up: Return in about 6 weeks (around 01/10/2021) for medication follow up, televist ok.   The patient indicates understanding of these issues and agrees with the plan.  Gabriel Earing, FNP

## 2020-11-29 NOTE — Patient Instructions (Signed)

## 2020-11-30 DIAGNOSIS — Z419 Encounter for procedure for purposes other than remedying health state, unspecified: Secondary | ICD-10-CM | POA: Diagnosis not present

## 2020-12-30 DIAGNOSIS — Z419 Encounter for procedure for purposes other than remedying health state, unspecified: Secondary | ICD-10-CM | POA: Diagnosis not present

## 2021-01-19 ENCOUNTER — Encounter: Payer: Self-pay | Admitting: Nurse Practitioner

## 2021-01-19 ENCOUNTER — Telehealth (INDEPENDENT_AMBULATORY_CARE_PROVIDER_SITE_OTHER): Payer: Medicaid Other | Admitting: Nurse Practitioner

## 2021-01-19 DIAGNOSIS — F32A Depression, unspecified: Secondary | ICD-10-CM

## 2021-01-19 DIAGNOSIS — F419 Anxiety disorder, unspecified: Secondary | ICD-10-CM | POA: Diagnosis not present

## 2021-01-19 DIAGNOSIS — F411 Generalized anxiety disorder: Secondary | ICD-10-CM | POA: Diagnosis not present

## 2021-01-19 MED ORDER — CITALOPRAM HYDROBROMIDE 10 MG PO TABS: 10.0000 mg | ORAL_TABLET | Freq: Every day | ORAL | 1 refills | Status: DC

## 2021-01-19 MED ORDER — PROPRANOLOL HCL 10 MG PO TABS: 10.0000 mg | ORAL_TABLET | Freq: Two times a day (BID) | ORAL | 1 refills | Status: DC | PRN

## 2021-01-19 NOTE — Progress Notes (Signed)
Virtual Visit via video Note   Due to COVID-19 pandemic this visit was conducted virtually. This visit type was conducted due to national recommendations for restrictions regarding the COVID-19 Pandemic (e.g. social distancing, sheltering in place) in an effort to limit this patient's exposure and mitigate transmission in our community. All issues noted in this document were discussed and addressed.  A physical exam was not performed with this format.  I connected with  Amber Hurst  on 01/19/21 at 12:41 by video and verified that I am speaking with the correct person using two identifiers. Amber Hurst is currently located at home and on one is currently with her during visit. The provider, Mary-Margaret Daphine Deutscher, FNP is located in their office at time of visit.  I discussed the limitations, risks, security and privacy concerns of performing an evaluation and management service by video  and the availability of in person appointments. I also discussed with the patient that there may be a patient responsible charge related to this service. The patient expressed understanding and agreed to proceed.   History and Present Illness:   Chief Complaint: anxiety   HPI:  Patient is currently on celexa and propranolol. She says combination is working well for her. She says she has been doing really well.  Depression screen [facility] 2/9 01/19/2021 11/29/2020 06/06/2020  Decreased Interest 0 1 0  Down, Depressed, Hopeless 0 1 0  PHQ - 2 Score 0 2 0  Altered sleeping - 0 0  Tired, decreased energy - 1 0  Change in appetite - 1 0  Feeling bad or failure about yourself  - 0 0  Trouble concentrating - 0 0  Moving slowly or fidgety/restless - 1 0  Suicidal thoughts - 0 0  PHQ-9 Score - 5 0  Difficult doing work/chores Somewhat difficult Not difficult at all -  Some recent data might be hidden   GAD 7 : Generalized Anxiety Score 11/29/2020 06/06/2020  Nervous, Anxious, on Edge 2 1  Control/stop worrying 0 0   Worry too much - different things 1 0  Trouble relaxing 0 0  Restless 0 0  Easily annoyed or irritable 1 1  Afraid - awful might happen 0 0  Total GAD 7 Score 4 2  Anxiety Difficulty Not difficult at all -       Outpatient Encounter Medications as of 01/19/2021  Medication Sig   citalopram (CELEXA) 10 MG tablet Take 1 tablet (10 mg total) by mouth daily.   hydrOXYzine (ATARAX/VISTARIL) 10 MG tablet Take 1 tablet (10 mg total) by mouth 3 (three) times daily as needed for anxiety.   propranolol (INDERAL) 10 MG tablet Take 1 tablet (10 mg total) by mouth 2 (two) times daily as needed (anxiety).   No facility-administered encounter medications on file as of 01/19/2021.    No past surgical history on file.  Family History  Problem Relation Age of Onset   Hypertension Father     New complaints: none  Social history: Lives with her step mom  Controlled substance contract: n/a    Review of Systems  Constitutional:  Negative for diaphoresis and weight loss.  Eyes:  Negative for blurred vision, double vision and pain.  Respiratory:  Negative for shortness of breath.   Cardiovascular:  Negative for chest pain, palpitations, orthopnea and leg swelling.  Gastrointestinal:  Negative for abdominal pain.  Skin:  Negative for rash.  Neurological:  Negative for dizziness, sensory change, loss of consciousness, weakness and headaches.  Endo/Heme/Allergies:  Negative for polydipsia. Does not bruise/bleed easily.  Psychiatric/Behavioral:  Negative for memory loss. The patient does not have insomnia.   All other systems reviewed and are negative.     Observations/Objective: Alert and oriented- answers all questions appropriately No distress   Assessment and Plan: Amber Hurst in today with chief complaint of No chief complaint on file.   1. Anxiety Stress management Meds ordered this encounter  Medications   propranolol (INDERAL) 10 MG tablet    Sig: Take 1 tablet (10 mg  total) by mouth 2 (two) times daily as needed (anxiety).    Dispense:  180 tablet    Refill:  1    Order Specific Question:   Supervising Provider    Answer:   Arville Care A [[id number]]   citalopram (CELEXA) 10 MG tablet    Sig: Take 1 tablet (10 mg total) by mouth daily.    Dispense:  90 tablet    Refill:  1    Order Specific Question:   Supervising Provider    Answer:   Arville Care A [[id number]]         Follow Up Instructions: prn    I discussed the assessment and treatment plan with the patient. The patient was provided an opportunity to ask questions and all were answered. The patient agreed with the plan and demonstrated an understanding of the instructions.   The patient was advised to call back or seek an in-person evaluation if the symptoms worsen or if the condition fails to improve as anticipated.  The above assessment and management plan was discussed with the patient. The patient verbalized understanding of and has agreed to the management plan. Patient is aware to call the clinic if symptoms persist or worsen. Patient is aware when to return to the clinic for a follow-up visit. Patient educated on when it is appropriate to go to the emergency department.  12:55  I provided 13 minutes of face-to-face time during this encounter.    Mary-Margaret Daphine Deutscher, FNP

## 2021-01-30 DIAGNOSIS — Z419 Encounter for procedure for purposes other than remedying health state, unspecified: Secondary | ICD-10-CM | POA: Diagnosis not present

## 2021-06-22 ENCOUNTER — Ambulatory Visit (INDEPENDENT_AMBULATORY_CARE_PROVIDER_SITE_OTHER): Payer: Medicaid Other | Admitting: Family

## 2021-06-22 ENCOUNTER — Encounter: Payer: Self-pay | Admitting: Family

## 2021-06-22 DIAGNOSIS — J069 Acute upper respiratory infection, unspecified: Secondary | ICD-10-CM

## 2021-06-22 MED ORDER — CETIRIZINE HCL 10 MG PO TABS
10.0000 mg | ORAL_TABLET | Freq: Every day | ORAL | 11 refills | Status: DC
Start: 2021-06-22 — End: 2021-10-11

## 2021-06-22 MED ORDER — BENZONATATE 200 MG PO CAPS: 200.0000 mg | ORAL_CAPSULE | Freq: Three times a day (TID) | ORAL | 1 refills | Status: DC | PRN

## 2021-06-22 MED ORDER — FLUTICASONE PROPIONATE 50 MCG/ACT NA SUSP: 2.0000 | Freq: Every day | NASAL | 6 refills | Status: DC

## 2021-06-22 NOTE — Patient Instructions (Signed)

## 2021-06-22 NOTE — Progress Notes (Signed)
? ?Virtual Visit  Note ?Due to COVID-19 pandemic this visit was conducted virtually. This visit type was conducted due to national recommendations for restrictions regarding the COVID-19 Pandemic (e.g. social distancing, sheltering in place) in an effort to limit this patient's exposure and mitigate transmission in our community. All issues noted in this document were discussed and addressed.  A physical exam was not performed with this format. ? ?I connected with Amber Hurst on 06/22/21 at 12:33 pm  by telephone and verified that I am speaking with the correct person using two identifiers. Amber Hurst is currently located at home and no one is currently with her during visit. The provider, Jannifer Rodney, FNP is located in their office at time of visit. ? ?I discussed the limitations, risks, security and privacy concerns of performing an evaluation and management service by telephone and the availability of in person appointments. I also discussed with the patient that there may be a patient responsible charge related to this service. The patient expressed understanding and agreed to proceed. ? ? ?Amber Hurst,you are scheduled for a virtual visit with your provider today.   ? ?Just as we do with appointments in the office, we must obtain your consent to participate.  Your consent will be active for this visit and any virtual visit you may have with one of our providers in the next 365 days.   ? ?If you have a MyChart account, I can also send a copy of this consent to you electronically.  All virtual visits are billed to your insurance company just like a traditional visit in the office.  As this is a virtual visit, video technology does not allow for your provider to perform a traditional examination.  This may limit your provider's ability to fully assess your condition.  If your provider identifies any concerns that need to be evaluated in person or the need to arrange testing such as labs, EKG, etc, we will make  arrangements to do so.   ? ?Although advances in technology are sophisticated, we cannot ensure that it will always work on either your end or our end.  If the connection with a video visit is poor, we may have to switch to a telephone visit.  With either a video or telephone visit, we are not always able to ensure that we have a secure connection.   I need to obtain your verbal consent now.   Are you willing to proceed with your visit today?  ? ?Amber Hurst has provided verbal consent on 06/22/2021 for a virtual visit (video or telephone). ? ? ?Jannifer Rodney, FNP ?06/22/2021  12:33 PM ? ? ?History and Present Illness: ? ?Sinus Problem ?This is a new problem. The current episode started in the past 7 days. The problem has been gradually worsening since onset. There has been no fever. Her pain is at a severity of 2/10. Associated symptoms include congestion, coughing, headaches, sinus pressure, a sore throat and swollen glands. Pertinent negatives include no hoarse voice or sneezing. Past treatments include oral decongestants. The treatment provided mild relief.  ? ? ? ?Review of Systems  ?HENT:  Positive for congestion, sinus pressure and sore throat. Negative for hoarse voice and sneezing.   ?Respiratory:  Positive for cough.   ?Neurological:  Positive for headaches.  ? ? ?Observations/Objective: ?No SOB or distress noted  ? ?Assessment and Plan: ?1. Viral URI ?- Take meds as prescribed ?- Use a cool mist humidifier  ?-Use saline nose sprays frequently ?-  Force fluids ?-For any cough or congestion ? Use plain Mucinex- regular strength or max strength is fine ?-For fever or aces or pains- take tylenol or ibuprofen. ?-Throat lozenges if help ?-Follow up if symptoms worsen or do not improve  ?- cetirizine (ZYRTEC) 10 MG tablet; Take 1 tablet (10 mg total) by mouth daily.  Dispense: 30 tablet; Refill: 11 ?- fluticasone (FLONASE) 50 MCG/ACT nasal spray; Place 2 sprays into both nostrils daily.  Dispense: 16 g; Refill: 6 ?-  benzonatate (TESSALON) 200 MG capsule; Take 1 capsule (200 mg total) by mouth 3 (three) times daily as needed.  Dispense: 30 capsule; Refill: 1 ? ? ?  ?I discussed the assessment and treatment plan with the patient. The patient was provided an opportunity to ask questions and all were answered. The patient agreed with the plan and demonstrated an understanding of the instructions. ?  ?The patient was advised to call back or seek an in-person evaluation if the symptoms worsen or if the condition fails to improve as anticipated. ? ?The above assessment and management plan was discussed with the patient. The patient verbalized understanding of and has agreed to the management plan. Patient is aware to call the clinic if symptoms persist or worsen. Patient is aware when to return to the clinic for a follow-up visit. Patient educated on when it is appropriate to go to the emergency department.  ? ?Time call ended:  12:44 pm  ? ?I provided 11 minutes of  non face-to-face time during this encounter. ? ? ? ?Jannifer Rodney, FNP ? ? ?

## 2021-09-13 ENCOUNTER — Encounter: Payer: Self-pay | Admitting: Family Medicine

## 2021-09-13 ENCOUNTER — Ambulatory Visit (INDEPENDENT_AMBULATORY_CARE_PROVIDER_SITE_OTHER): Payer: Medicaid Other | Admitting: Family Medicine

## 2021-09-13 DIAGNOSIS — J069 Acute upper respiratory infection, unspecified: Secondary | ICD-10-CM

## 2021-09-13 DIAGNOSIS — R062 Wheezing: Secondary | ICD-10-CM | POA: Diagnosis not present

## 2021-09-13 MED ORDER — AMOXICILLIN-POT CLAVULANATE 875-125 MG PO TABS: 1.0000 | ORAL_TABLET | Freq: Two times a day (BID) | ORAL | 0 refills | Status: AC

## 2021-09-13 MED ORDER — ALBUTEROL SULFATE HFA 108 (90 BASE) MCG/ACT IN AERS: 2.0000 | INHALATION_SPRAY | Freq: Four times a day (QID) | RESPIRATORY_TRACT | 0 refills | Status: DC | PRN

## 2021-09-13 NOTE — Progress Notes (Signed)
   Virtual Visit  Note Due to COVID-19 pandemic this visit was conducted virtually. This visit type was conducted due to national recommendations for restrictions regarding the COVID-19 Pandemic (e.g. social distancing, sheltering in place) in an effort to limit this patient's exposure and mitigate transmission in our community. All issues noted in this document were discussed and addressed.  A physical exam was not performed with this format.  I connected with Amber Hurst on 09/13/21 at 1120 by telephone and verified that I am speaking with the correct person using two identifiers. [name] is currently located at work and no one is currently with her during the visit. The provider, Gabriel Earing, FNP is located in their office at time of visit.  I discussed the limitations, risks, security and privacy concerns of performing an evaluation and management service by telephone and the availability of in person appointments. I also discussed with the patient that there may be a patient responsible charge related to this service. The patient expressed understanding and agreed to proceed.  CC: sinusitis  History and Present Illness:  HPI Daily reports congestion with HA and facial pressure for the last week. She reports a mild cough. She is wheezing. Denies history of asthma. She denies fever, chills, sore throat, nausea, vomiting, or diarrhea. She has taken mucinex and zyrtec without much improvement.    ROS As per HPI.   Observations/Objective: Alert and oriented x 3. Able to speak in full sentences without difficulty.   Assessment and Plan: Amber Hurst was seen today for sinusitis.  Diagnoses and all orders for this visit:  Upper respiratory tract infection, unspecified type Ongoing symptoms without improvement with OTC medications. Augmentin as below. Discussed symptomatic care and return precautions.  -     amoxicillin-clavulanate (AUGMENTIN) 875-125 MG tablet; Take 1 tablet by mouth 2  (two) times daily for 7 days.  Wheezing Try albuterol prn.  -     albuterol (VENTOLIN HFA) 108 (90 Base) MCG/ACT inhaler; Inhale 2 puffs into the lungs every 6 (six) hours as needed for wheezing or shortness of breath.    Follow Up Instructions: Return to office for new or worsening symptoms, or if symptoms persist.     I discussed the assessment and treatment plan with the patient. The patient was provided an opportunity to ask questions and all were answered. The patient agreed with the plan and demonstrated an understanding of the instructions.   The patient was advised to call back or seek an in-person evaluation if the symptoms worsen or if the condition fails to improve as anticipated.  The above assessment and management plan was discussed with the patient. The patient verbalized understanding of and has agreed to the management plan. Patient is aware to call the clinic if symptoms persist or worsen. Patient is aware when to return to the clinic for a follow-up visit. Patient educated on when it is appropriate to go to the emergency department.   Time call ended:  1132  I provided 12 minutes of  non face-to-face time during this encounter.    Gabriel Earing, FNP

## 2021-09-28 ENCOUNTER — Emergency Department (HOSPITAL_BASED_OUTPATIENT_CLINIC_OR_DEPARTMENT_OTHER)
Admission: EM | Admit: 2021-09-28 | Discharge: 2021-09-28 | Disposition: A | Discharge status: Home / Self Care | Payer: Medicaid Other | Attending: Emergency Medicine | Admitting: Emergency Medicine

## 2021-09-28 ENCOUNTER — Other Ambulatory Visit: Payer: Self-pay | Admitting: Family Medicine

## 2021-09-28 ENCOUNTER — Other Ambulatory Visit: Payer: Self-pay

## 2021-09-28 ENCOUNTER — Encounter (HOSPITAL_BASED_OUTPATIENT_CLINIC_OR_DEPARTMENT_OTHER): Payer: Self-pay | Admitting: Emergency Medicine

## 2021-09-28 DIAGNOSIS — R0989 Other specified symptoms and signs involving the circulatory and respiratory systems: Secondary | ICD-10-CM | POA: Diagnosis not present

## 2021-09-28 DIAGNOSIS — R200 Anesthesia of skin: Secondary | ICD-10-CM | POA: Diagnosis not present

## 2021-09-28 DIAGNOSIS — R062 Wheezing: Secondary | ICD-10-CM

## 2021-09-28 DIAGNOSIS — R000 Tachycardia, unspecified: Secondary | ICD-10-CM

## 2021-09-28 LAB — URINALYSIS, ROUTINE W REFLEX MICROSCOPIC
Bilirubin Urine: NEGATIVE
Glucose, UA: 100 mg/dL — AB
Ketones, ur: NEGATIVE mg/dL
Nitrite: NEGATIVE
Protein, ur: NEGATIVE mg/dL
Specific Gravity, Urine: 1.025 (ref 1.005–1.030)
pH: 6 (ref 5.0–8.0)

## 2021-09-28 LAB — COMPREHENSIVE METABOLIC PANEL
ALT: 17 U/L (ref 0–44)
AST: 18 U/L (ref 15–41)
Albumin: 4.2 g/dL (ref 3.5–5.0)
Alkaline Phosphatase: 86 U/L (ref 38–126)
Anion gap: 8 (ref 5–15)
BUN: 11 mg/dL (ref 6–20)
CO2: 22 mmol/L (ref 22–32)
Calcium: 8.8 mg/dL — ABNORMAL LOW (ref 8.9–10.3)
Chloride: 106 mmol/L (ref 98–111)
Creatinine, Ser: 0.77 mg/dL (ref 0.44–1.00)
GFR, Estimated: >60 mL/min (ref >60)
Glucose, Bld: 92 mg/dL (ref 70–99)
Potassium: 4 mmol/L (ref 3.5–5.1)
Sodium: 136 mmol/L (ref 135–145)
Total Bilirubin: 0.5 mg/dL (ref 0.3–1.2)
Total Protein: 7.6 g/dL (ref 6.5–8.1)

## 2021-09-28 LAB — URINALYSIS, MICROSCOPIC (REFLEX)

## 2021-09-28 LAB — CBC WITH DIFFERENTIAL/PLATELET
Abs Immature Granulocytes: 0.06 10*3/uL (ref 0.00–0.07)
Basophils Absolute: 0.1 10*3/uL (ref 0.0–0.1)
Basophils Relative: 0 %
Eosinophils Absolute: 0.1 10*3/uL (ref 0.0–0.5)
Eosinophils Relative: 0 %
HCT: 39.8 % (ref 36.0–46.0)
Hemoglobin: 13.3 g/dL (ref 12.0–15.0)
Immature Granulocytes: 1 %
Lymphocytes Relative: 26 %
Lymphs Abs: 3 10*3/uL (ref 0.7–4.0)
MCH: 30.8 pg (ref 26.0–34.0)
MCHC: 33.4 g/dL (ref 30.0–36.0)
MCV: 92.1 fL (ref 80.0–100.0)
Monocytes Absolute: 1 10*3/uL (ref 0.1–1.0)
Monocytes Relative: 9 %
Neutro Abs: 7.3 10*3/uL (ref 1.7–7.7)
Neutrophils Relative %: 64 %
Platelets: 386 10*3/uL (ref 150–400)
RBC: 4.32 MIL/uL (ref 3.87–5.11)
RDW: 11.9 % (ref 11.5–15.5)
WBC: 11.4 10*3/uL — ABNORMAL HIGH (ref 4.0–10.5)
nRBC: 0 % (ref 0.0–0.2)

## 2021-09-28 LAB — PREGNANCY, URINE: Preg Test, Ur: NEGATIVE

## 2021-09-28 NOTE — ED Provider Notes (Signed)
[facility] EMERGENCY DEPARTMENT Provider Note   CSN: [id number] Arrival date & time: 09/28/21  1214     History  Chief Complaint  Patient presents with   Numbness    Amber Hurst is a 21 y.o. female presenting to ED with complaint of labile blood pressures and heart rates.  Supplemental history provided by her mother at the bedside.  They report for the past 6 months or so, they have noticed that her heart rates can be fluctuating from 40s-120s bpm.  She feels this can occur at random but notices it more often when she is standing up suddenly.  They also report concerned her blood pressures have been both high and low.  She was prescribed propanolol for anxiety, says that she takes it "only rarely".  Today they were concerned that her arms and legs felt heavy.  The symptoms have resolved and she feels back to normal.  There is no family history of autoimmune disease that they are aware of.  Patient reports that she drinks lots of water daily  HPI     Home Medications Prior to Admission medications   Medication Sig Start Date End Date Taking? Authorizing Provider  benzonatate (TESSALON) 200 MG capsule Take 1 capsule (200 mg total) by mouth 3 (three) times daily as needed. 06/22/21   Junie Spencer, FNP  cetirizine (ZYRTEC) 10 MG tablet Take 1 tablet (10 mg total) by mouth daily. 06/22/21   Junie Spencer, FNP  citalopram (CELEXA) 10 MG tablet Take 1 tablet (10 mg total) by mouth daily. 01/19/21   Daphine Deutscher, Mary-Margaret, FNP  fluticasone (FLONASE) 50 MCG/ACT nasal spray Place 2 sprays into both nostrils daily. 06/22/21   Jannifer Rodney A, FNP  hydrOXYzine (ATARAX/VISTARIL) 10 MG tablet Take 1 tablet (10 mg total) by mouth 3 (three) times daily as needed for anxiety. 11/13/20   Gabriel Earing, FNP  propranolol (INDERAL) 10 MG tablet Take 1 tablet (10 mg total) by mouth 2 (two) times daily as needed (anxiety). 01/19/21   Daphine Deutscher, Mary-Margaret, FNP  VENTOLIN HFA 108 (90 Base)  MCG/ACT inhaler INHALE 2 PUFFS BY MOUTH EVERY 6 HOURS AS NEEDED FOR WHEEZING OR SHORTNESS OF BREATH 09/28/21   Bennie Pierini, FNP      Allergies    Amoxicillin and Chocolate flavor    Review of Systems   Review of Systems  Physical Exam Updated Vital Signs BP 136/67   Pulse 67   Temp 98.5 F (36.9 C) (Oral)   Resp 17   SpO2 100%  Physical Exam Constitutional:      General: She is not in acute distress. HENT:     Head: Normocephalic and atraumatic.  Eyes:     Conjunctiva/sclera: Conjunctivae normal.     Pupils: Pupils are equal, round, and reactive to light.  Cardiovascular:     Rate and Rhythm: Normal rate and regular rhythm.     Pulses: Normal pulses.  Pulmonary:     Effort: Pulmonary effort is normal. No respiratory distress.  Abdominal:     General: There is no distension.     Tenderness: There is no abdominal tenderness.  Skin:    General: Skin is warm and dry.  Neurological:     General: No focal deficit present.     Mental Status: She is alert and oriented to person, place, and time. Mental status is at baseline.  Psychiatric:        Mood and Affect: Mood normal.  Behavior: Behavior normal.     ED Results / Procedures / Treatments   Labs (all labs ordered are listed, but only abnormal results are displayed) Labs Reviewed  CBC WITH DIFFERENTIAL/PLATELET - Abnormal; Notable for the following components:      Result Value   WBC 11.4 (*)    All other components within normal limits  COMPREHENSIVE METABOLIC PANEL - Abnormal; Notable for the following components:   Calcium 8.8 (*)    All other components within normal limits  URINALYSIS, ROUTINE W REFLEX MICROSCOPIC - Abnormal; Notable for the following components:   APPearance HAZY (*)    Glucose, UA 100 (*)    Hgb urine dipstick TRACE (*)    Leukocytes,Ua SMALL (*)    All other components within normal limits  URINALYSIS, MICROSCOPIC (REFLEX) - Abnormal; Notable for the following components:    Bacteria, UA MANY (*)    All other components within normal limits  PREGNANCY, URINE    EKG EKG Interpretation  Date/Time:  Friday September 28 2021 13:24:54 EDT Ventricular Rate:  69 PR Interval:  134 QRS Duration: 76 QT Interval:  406 QTC Calculation: 435 R Axis:   59 Text Interpretation: Normal sinus rhythm Confirmed by Alvester Chou [phone]) on 09/28/2021 3:14:09 PM  Radiology No results found.  Procedures Procedures    Medications Ordered in ED Medications - No data to display  ED Course/ Medical Decision Making/ A&P                           Medical Decision Making  This patient presents to the ED with concern for labile heart rate and blood pressure. This involves an extensive number of treatment options, and is a complaint that carries with it a high risk of complications and morbidity.  The differential diagnosis includes autodystonia (most likely) including POTS vs arrhythmia vs autoimmune condition vs electrolyte derangement vs other  Additional history obtained from patient's mother at bedside  I ordered and personally interpreted labs.  The pertinent results include: Electrolytes, hemoglobin within normal limits.  UA without any convincing sign of infection  The patient was maintained on a cardiac monitor.  I personally viewed and interpreted the cardiac monitored which showed an underlying rhythm of: Sinus rhythm with occasional sinus  Per my interpretation the patient's ECG shows normal sinus rhythm with no acute ischemic findings, no significant arrhythmia  I have reviewed the patients home medicines and have made adjustments as needed.  I recommended that she discontinue propanolol at this time for anxiety, as the beta-blocker may be inducing bradycardia which could be worsening potential POTS.  Test Considered: Low clinical suspicion for stroke or PE based on his clinical presentation.  She has no active neurodeficits.  No hypoxia.  Her waxing and waning  tachycardia is not consistent with PE  Dispostion:  After consideration of the diagnostic results and the patients response to treatment, I feel that the patent would benefit from outpatient follow-up with cardiology for possible POTS - a referral was placed.         Final Clinical Impression(s) / ED Diagnoses Final diagnoses:  Tachycardia  Labile blood pressure    Rx / DC Orders ED Discharge Orders          Ordered    Ambulatory referral to Cardiology       Comments: Possible POTS   09/28/21 1532              Vonna Brabson,  Kermit Balo, MD 09/28/21 1537

## 2021-09-28 NOTE — ED Triage Notes (Signed)
Patient presents to ED via POV from home with mother. Patient reports bilateral lower extremity numbness the began today. Ambulatory with steady gait. No slurred speech. No facial droop. Expresses concerns about "a blood pressure problem".

## 2021-09-28 NOTE — Telephone Encounter (Signed)
Last OV 09/13/2021. Last RF 09/13/2021. Next OV None scheduled

## 2021-10-07 NOTE — Progress Notes (Signed)
Cardiology Office Note:    Date:  10/12/2021   ID:  Amber Hurst, DOB 06-Jun-2000, MRN [id number]  PCP:  Bennie Pierini, FNP  Cardiologist:  None  Electrophysiologist:  None   Referring MD: Terald Sleeper, MD   Chief Complaint  Patient presents with   Palpitations         History of Present Illness:    Amber Hurst is a 21 y.o. female with a hx of ADD who presents as an ED follow-up for palpitations.  She was seen in the ED on 09/28/2021 for labile heart rate and blood pressure.  Work-up unremarkable, she was referred to cardiology for evaluation.  She reports has been having palpitations about 1-2 times per week.  Feels like heart is racing, can last for hours.  She has been taking propranolol as needed.  She reports symptoms sometimes occur when she stands.  During episodes she reports feeling lightheaded and short of breath.  Denies any syncope.  Can have chest pain during episodes.  She reports shortness of breath with walking up stairs but denies any chest pain.  She does not exercise.  She has vaped since age 84.  Drinks 1 caffeinated soda per day.  She stopped drinking alcohol since symptoms started, previously would drink 1 day/week but would drink heavily that day, may have up to 12 drinks.  No known family history of heart disease.  Past Medical History:  Diagnosis Date   ADD (attention deficit disorder)    Environmental allergies    respiratory     No past surgical history on file.  Current Medications: Current Meds  Medication Sig   citalopram (CELEXA) 10 MG tablet Take 1 tablet (10 mg total) by mouth daily.   hydrOXYzine (ATARAX/VISTARIL) 10 MG tablet Take 1 tablet (10 mg total) by mouth 3 (three) times daily as needed for anxiety.   VENTOLIN HFA 108 (90 Base) MCG/ACT inhaler INHALE 2 PUFFS BY MOUTH EVERY 6 HOURS AS NEEDED FOR WHEEZING OR SHORTNESS OF BREATH     Allergies:   Amoxicillin and Chocolate flavor   Social History   Socioeconomic History   Marital  status: Single    Spouse name: Not on file   Number of children: Not on file   Years of education: Not on file   Highest education level: Not on file  Occupational History   Not on file  Tobacco Use   Smoking status: Never   Smokeless tobacco: Never  Vaping Use   Vaping Use: Every day  Substance and Sexual Activity   Alcohol use: Yes   Drug use: No   Sexual activity: Yes    Birth control/protection: None  Other Topics Concern   Not on file  Social History Narrative   Not on file   Social Determinants of Health   Financial Resource Strain: Low Risk  (06/06/2020)   Overall Financial Resource Strain (CARDIA)    Difficulty of Paying Living Expenses: Not very hard  Food Insecurity: No Food Insecurity (06/06/2020)   Hunger Vital Sign    Worried About Running Out of Food in the Last Year: Never true    Ran Out of Food in the Last Year: Never true  Transportation Needs: No Transportation Needs (06/06/2020)   PRAPARE - Administrator, Civil Service (Medical): No    Lack of Transportation (Non-Medical): No  Physical Activity: Insufficiently Active (06/06/2020)   Exercise Vital Sign    Days of Exercise per Week: 2 days  Minutes of Exercise per Session: 10 min  Stress: No Stress Concern Present (06/06/2020)   [employer] of Occupational Health - Occupational Stress Questionnaire    Feeling of Stress : Only a little  Social Connections: Socially Isolated (06/06/2020)   Social Connection and Isolation Panel [NHANES]    Frequency of Communication with Friends and Family: Once a week    Frequency of Social Gatherings with Friends and Family: Once a week    Attends Religious Services: Never    Database administrator or Organizations: No    Attends Engineer, structural: Never    Marital Status: Never married     Family History: The patient's family history includes Hypertension in her father.  ROS:   Please see the history of present illness.     All other  systems reviewed and are negative.  EKGs/Labs/Other Studies Reviewed:    The following studies were reviewed today:   EKG:   10/11/2021: Normal sinus rhythm, rate 88, Q waves in V1/2, no ST abnormalities  Recent Labs: 09/28/2021: ALT 17; BUN 11; Creatinine, Ser 0.77; Hemoglobin 13.3; Platelets 386; Potassium 4.0; Sodium 136  Recent Lipid Panel No results found for: "CHOL", "TRIG", "HDL", "CHOLHDL", "VLDL", "LDLCALC", "LDLDIRECT"  Physical Exam:    VS:  BP 125/89   Pulse 88   Ht 5\' 2"  (1.575 m)   Wt 190 lb 9.6 oz (86.5 kg)   SpO2 97%   BMI 34.86 kg/m     Wt Readings from Last 3 Encounters:  10/11/21 190 lb 9.6 oz (86.5 kg)  11/29/20 190 lb (86.2 kg)  06/06/20 190 lb (86.2 kg)     GEN:  Well nourished, well developed in no acute distress HEENT: Normal NECK: No JVD; No carotid bruits LYMPHATICS: No lymphadenopathy CARDIAC: RRR, no murmurs, rubs, gallops RESPIRATORY:  Clear to auscultation without rales, wheezing or rhonchi  ABDOMEN: Soft, non-tender, non-distended MUSCULOSKELETAL:  No edema; No deformity  SKIN: Warm and dry NEUROLOGIC:  Alert and oriented x 3 PSYCHIATRIC:  Normal affect   ASSESSMENT:    1. Palpitations   2. Orthostatic hypotension   3. Vaping nicotine dependence, tobacco product    PLAN:    Palpitations: Description concerning for arrhythmia, will evaluate with Zio patch x2 weeks.  Check echocardiogram to rule out structural heart disease.  Check TSH  Orthostatic hypotension: BP dropped from 129/81 lying to 100/68 standing, heart rate increased from 82-92.  She is not on any antihypertensives.  Could be contributing to symptoms as above.  Encouraged to stay well-hydrated.  Vaping: Counseled on the risk of vaping and cessation encouraged.  RTC in 3 months   Medication Adjustments/Labs and Tests Ordered: Current medicines are reviewed at length with the patient today.  Concerns regarding medicines are outlined above.  Orders Placed This  Encounter  Procedures   TSH   LONG TERM MONITOR (3-14 DAYS)   EKG 12-Lead   ECHOCARDIOGRAM COMPLETE   No orders of the defined types were placed in this encounter.   Patient Instructions  Medication Instructions:  Your physician recommends that you continue on your current medications as directed. Please refer to the Current Medication list given to you today.  *If you need a refill on your cardiac medications before your next appointment, please call your pharmacy*  Lab Work: TSH  If you have labs (blood work) drawn today and your tests are completely normal, you will receive your results only by: MyChart Message (if you have MyChart) OR A paper  copy in the mail If you have any lab test that is abnormal or we need to change your treatment, we will call you to review the results.   Testing/Procedures: Your physician has requested that you have an echocardiogram. Echocardiography is a painless test that uses sound waves to create images of your heart. It provides your doctor with information about the size and shape of your heart and how well your heart's chambers and valves are working. This procedure takes approximately one hour. There are no restrictions for this procedure.  ZIO XT- Long Term Monitor Instructions   Your physician has requested you wear a ZIO patch monitor for _14__ days.  This is a single patch monitor.   [employer] supplies one patch monitor per enrollment. Additional stickers are not available. Please do not apply patch if you will be having a Nuclear Stress Test, Echocardiogram, Cardiac CT, MRI, or Chest Xray during the period you would be wearing the monitor. The patch cannot be worn during these tests. You cannot remove and re-apply the ZIO XT patch monitor.  Your ZIO patch monitor will be sent Fed Ex from [employer] directly to your home address. It may take 3-5 days to receive your monitor after you have been enrolled.  Once you have received your  monitor, please review the enclosed instructions. Your monitor has already been registered assigning a specific monitor serial # to you.  Billing and Patient Assistance Program Information   We have supplied [employer] with any of your insurance information on file for billing purposes. [employer] offers a sliding scale Patient Assistance Program for patients that do not have insurance, or whose insurance does not completely cover the cost of the ZIO monitor.   You must apply for the Patient Assistance Program to qualify for this discounted rate.     To apply, please call [employer] at [phone], select option 4, then select option 2, and ask to apply for Patient Assistance Program.  Meredeth Ide will ask your household income, and how many people are in your household.  They will quote your out-of-pocket cost based on that information.  [employer] will also be able to set up a 33-month, interest-free payment plan if needed.  Applying the monitor   Shave hair from upper left chest.  Hold abrader disc by orange tab. Rub abrader in 40 strokes over the upper left chest as indicated in your monitor instructions.  Clean area with 4 enclosed alcohol pads. Let dry.  Apply patch as indicated in monitor instructions. Patch will be placed under collarbone on left side of chest with arrow pointing upward.  Rub patch adhesive wings for 2 minutes. Remove white label marked "1". Remove the white label marked "2". Rub patch adhesive wings for 2 additional minutes.  While looking in a mirror, press and release button in center of patch. A small green light will flash 3-4 times. This will be your only indicator that the monitor has been turned on. ?  Do not shower for the first 24 hours. You may shower after the first 24 hours.  Press the button if you feel a symptom. You will hear a small click. Record Date, Time and Symptom in the Patient Logbook.  When you are ready to remove the patch, follow instructions on the last 2 pages  of the Patient Logbook. Stick patch monitor onto the last page of Patient Logbook.  Place Patient Logbook in the blue and white box.  Use locking tab on box and tape box  closed securely.  The blue and white box has prepaid postage on it. Please place it in the mailbox as soon as possible. Your physician should have your test results approximately 7 days after the monitor has been mailed back to [facility].  Call [facility] Customer Care at [phone] if you have questions regarding your ZIO XT patch monitor. Call them immediately if you see an orange light blinking on your monitor.  If your monitor falls off in less than 4 days, contact our Monitor department at [phone]. ?If your monitor becomes loose or falls off after 4 days call [employer] at [phone] for suggestions on securing your monitor.?  Follow-Up: At [facility], you and your health needs are our priority.  As part of our continuing mission to provide you with exceptional heart care, we have created designated Provider Care Teams.  These Care Teams include your primary Cardiologist (physician) and Advanced Practice Providers (APPs -  Physician Assistants and Nurse Practitioners) who all work together to provide you with the care you need, when you need it.  We recommend signing up for the patient portal called "MyChart".  Sign up information is provided on this After Visit Summary.  MyChart is used to connect with patients for Virtual Visits (Telemedicine).  Patients are able to view lab/test results, encounter notes, upcoming appointments, etc.  Non-urgent messages can be sent to your provider as well.   To learn more about what you can do with MyChart, go to [email or web address].    Your next appointment:   3 month(s)  The format for your next appointment:   In Person  Provider:   Dr. Bjorn Pippin  Important Information About Sugar         Signed, Little Ishikawa, MD  10/12/2021 6:47 AM    [facility]

## 2021-10-11 ENCOUNTER — Ambulatory Visit (INDEPENDENT_AMBULATORY_CARE_PROVIDER_SITE_OTHER): Payer: Medicaid Other | Admitting: Cardiology

## 2021-10-11 ENCOUNTER — Ambulatory Visit (INDEPENDENT_AMBULATORY_CARE_PROVIDER_SITE_OTHER): Payer: Self-pay

## 2021-10-11 ENCOUNTER — Encounter: Payer: Self-pay | Admitting: Cardiology

## 2021-10-11 VITALS — BP 125/89 | HR 88 | Ht 62.0 in | Wt 190.6 lb

## 2021-10-11 DIAGNOSIS — I951 Orthostatic hypotension: Secondary | ICD-10-CM

## 2021-10-11 DIAGNOSIS — R002 Palpitations: Secondary | ICD-10-CM

## 2021-10-11 DIAGNOSIS — F17290 Nicotine dependence, other tobacco product, uncomplicated: Secondary | ICD-10-CM

## 2021-10-11 NOTE — Progress Notes (Unsigned)
Enrolled patient for a 14 day Zio XT  monitor to be mailed to patients home  °

## 2021-10-11 NOTE — Patient Instructions (Signed)
Medication Instructions:  Your physician recommends that you continue on your current medications as directed. Please refer to the Current Medication list given to you today.  *If you need a refill on your cardiac medications before your next appointment, please call your pharmacy*  Lab Work: TSH  If you have labs (blood work) drawn today and your tests are completely normal, you will receive your results only by: MyChart Message (if you have MyChart) OR A paper copy in the mail If you have any lab test that is abnormal or we need to change your treatment, we will call you to review the results.   Testing/Procedures: Your physician has requested that you have an echocardiogram. Echocardiography is a painless test that uses sound waves to create images of your heart. It provides your doctor with information about the size and shape of your heart and how well your heart's chambers and valves are working. This procedure takes approximately one hour. There are no restrictions for this procedure.  ZIO XT- Long Term Monitor Instructions   Your physician has requested you wear a ZIO patch monitor for _14__ days.  This is a single patch monitor.   [employer] supplies one patch monitor per enrollment. Additional stickers are not available. Please do not apply patch if you will be having a Nuclear Stress Test, Echocardiogram, Cardiac CT, MRI, or Chest Xray during the period you would be wearing the monitor. The patch cannot be worn during these tests. You cannot remove and re-apply the ZIO XT patch monitor.  Your ZIO patch monitor will be sent Fed Ex from [employer] directly to your home address. It may take 3-5 days to receive your monitor after you have been enrolled.  Once you have received your monitor, please review the enclosed instructions. Your monitor has already been registered assigning a specific monitor serial # to you.  Billing and Patient Assistance Program Information   We have  supplied [employer] with any of your insurance information on file for billing purposes. [employer] offers a sliding scale Patient Assistance Program for patients that do not have insurance, or whose insurance does not completely cover the cost of the ZIO monitor.   You must apply for the Patient Assistance Program to qualify for this discounted rate.     To apply, please call [employer] at [phone], select option 4, then select option 2, and ask to apply for Patient Assistance Program.  Meredeth Ide will ask your household income, and how many people are in your household.  They will quote your out-of-pocket cost based on that information.  [employer] will also be able to set up a 38-month, interest-free payment plan if needed.  Applying the monitor   Shave hair from upper left chest.  Hold abrader disc by orange tab. Rub abrader in 40 strokes over the upper left chest as indicated in your monitor instructions.  Clean area with 4 enclosed alcohol pads. Let dry.  Apply patch as indicated in monitor instructions. Patch will be placed under collarbone on left side of chest with arrow pointing upward.  Rub patch adhesive wings for 2 minutes. Remove white label marked "1". Remove the white label marked "2". Rub patch adhesive wings for 2 additional minutes.  While looking in a mirror, press and release button in center of patch. A small green light will flash 3-4 times. This will be your only indicator that the monitor has been turned on. ?  Do not shower for the first 24 hours. You may shower  after the first 24 hours.  Press the button if you feel a symptom. You will hear a small click. Record Date, Time and Symptom in the Patient Logbook.  When you are ready to remove the patch, follow instructions on the last 2 pages of the Patient Logbook. Stick patch monitor onto the last page of Patient Logbook.  Place Patient Logbook in the blue and white box.  Use locking tab on box and tape box closed securely.  The blue and  white box has prepaid postage on it. Please place it in the mailbox as soon as possible. Your physician should have your test results approximately 7 days after the monitor has been mailed back to [facility].  Call [facility] Customer Care at [phone] if you have questions regarding your ZIO XT patch monitor. Call them immediately if you see an orange light blinking on your monitor.  If your monitor falls off in less than 4 days, contact our Monitor department at [phone]. ?If your monitor becomes loose or falls off after 4 days call [employer] at [phone] for suggestions on securing your monitor.?  Follow-Up: At [facility], you and your health needs are our priority.  As part of our continuing mission to provide you with exceptional heart care, we have created designated Provider Care Teams.  These Care Teams include your primary Cardiologist (physician) and Advanced Practice Providers (APPs -  Physician Assistants and Nurse Practitioners) who all work together to provide you with the care you need, when you need it.  We recommend signing up for the patient portal called "MyChart".  Sign up information is provided on this After Visit Summary.  MyChart is used to connect with patients for Virtual Visits (Telemedicine).  Patients are able to view lab/test results, encounter notes, upcoming appointments, etc.  Non-urgent messages can be sent to your provider as well.   To learn more about what you can do with MyChart, go to [email or web address].    Your next appointment:   3 month(s)  The format for your next appointment:   In Person  Provider:   Dr. Bjorn Pippin  Important Information About Sugar

## 2021-10-15 DIAGNOSIS — R002 Palpitations: Secondary | ICD-10-CM

## 2021-10-25 ENCOUNTER — Encounter: Payer: Self-pay | Admitting: Cardiology

## 2021-10-31 ENCOUNTER — Ambulatory Visit (HOSPITAL_BASED_OUTPATIENT_CLINIC_OR_DEPARTMENT_OTHER)
Admission: RE | Admit: 2021-10-31 | Discharge: 2021-10-31 | Disposition: A | Discharge status: Home / Self Care | Payer: Self-pay | Source: Ambulatory Visit | Attending: Cardiology | Admitting: Cardiology

## 2021-10-31 DIAGNOSIS — R0609 Other forms of dyspnea: Secondary | ICD-10-CM

## 2021-10-31 DIAGNOSIS — R002 Palpitations: Secondary | ICD-10-CM | POA: Insufficient documentation

## 2021-10-31 LAB — ECHOCARDIOGRAM COMPLETE
AR max vel: 2.42 cm2
AV Area VTI: 2.23 cm2
AV Area mean vel: 2.15 cm2
AV Mean grad: 4 mmHg
AV Peak grad: 6 mmHg
Ao pk vel: 1.22 m/s
Area-P 1/2: 4.96 cm2
S' Lateral: 2.9 cm

## 2021-10-31 NOTE — Progress Notes (Signed)
  Echocardiogram 2D Echocardiogram has been performed.  Amber Hurst F 10/31/2021, 3:12 PM

## 2021-11-01 ENCOUNTER — Other Ambulatory Visit (HOSPITAL_BASED_OUTPATIENT_CLINIC_OR_DEPARTMENT_OTHER): Payer: Self-pay

## 2021-11-01 LAB — TSH: TSH: 1.52 u[IU]/mL (ref 0.450–4.500)

## 2021-11-05 ENCOUNTER — Other Ambulatory Visit: Payer: Self-pay | Admitting: *Deleted

## 2021-11-05 ENCOUNTER — Encounter: Payer: Self-pay | Admitting: Cardiology

## 2021-11-05 DIAGNOSIS — R931 Abnormal findings on diagnostic imaging of heart and coronary circulation: Secondary | ICD-10-CM

## 2021-11-05 DIAGNOSIS — Z01812 Encounter for preprocedural laboratory examination: Secondary | ICD-10-CM

## 2021-11-05 DIAGNOSIS — R002 Palpitations: Secondary | ICD-10-CM

## 2021-11-13 ENCOUNTER — Encounter: Payer: Self-pay | Admitting: *Deleted

## 2021-11-13 ENCOUNTER — Other Ambulatory Visit: Payer: Self-pay | Admitting: *Deleted

## 2021-11-13 DIAGNOSIS — R931 Abnormal findings on diagnostic imaging of heart and coronary circulation: Secondary | ICD-10-CM

## 2021-11-13 DIAGNOSIS — Z01812 Encounter for preprocedural laboratory examination: Secondary | ICD-10-CM

## 2021-11-13 DIAGNOSIS — R002 Palpitations: Secondary | ICD-10-CM

## 2022-01-22 ENCOUNTER — Telehealth (HOSPITAL_COMMUNITY): Payer: Self-pay | Admitting: Emergency Medicine

## 2022-01-22 NOTE — Telephone Encounter (Signed)
Reaching out to patient to offer assistance regarding upcoming cardiac imaging study; pt verbalizes understanding of appt date/time, parking situation and where to check in, pre-test NPO status and medications ordered, and verified current allergies; name and call back number provided for further questions should they arise Amber Bond RN Navigator Cardiac Imaging [facility] [phone] office [phone] cell   Arrival 730 wc Left arm IV preferred Denies metal Denies claustro Silicone nose ring, acrylic nipple rings

## 2022-01-22 NOTE — Telephone Encounter (Signed)
Attempted to call patient regarding upcoming cardiac MR appointment. Left message on voicemail with name and callback number Marye Eagen RN Navigator Cardiac Imaging [facility] [phone] Office [phone] Cell  

## 2022-01-23 ENCOUNTER — Other Ambulatory Visit: Payer: Self-pay | Admitting: Cardiology

## 2022-01-23 ENCOUNTER — Ambulatory Visit (HOSPITAL_COMMUNITY)
Admission: RE | Admit: 2022-01-23 | Discharge: 2022-01-23 | Disposition: A | Discharge status: Home / Self Care | Payer: Self-pay | Source: Ambulatory Visit | Attending: Cardiology | Admitting: Cardiology

## 2022-01-23 ENCOUNTER — Encounter: Payer: Self-pay | Admitting: Cardiology

## 2022-01-23 DIAGNOSIS — Q211 Atrial septal defect, unspecified: Secondary | ICD-10-CM

## 2022-01-23 DIAGNOSIS — R931 Abnormal findings on diagnostic imaging of heart and coronary circulation: Secondary | ICD-10-CM

## 2022-01-23 DIAGNOSIS — R002 Palpitations: Secondary | ICD-10-CM

## 2022-01-23 DIAGNOSIS — Z01812 Encounter for preprocedural laboratory examination: Secondary | ICD-10-CM

## 2022-01-23 MED ORDER — LORAZEPAM 2 MG/ML IJ SOLN
0.5000 mg | Freq: Once | INTRAMUSCULAR | Status: AC
  Filled 2022-01-23: qty 0.25

## 2022-01-23 MED ORDER — LORAZEPAM 2 MG/ML IJ SOLN
0.5000 mg | Freq: Once | INTRAMUSCULAR | Status: AC
  Administered 2022-01-23: 0.5 mg via INTRAVENOUS
  Filled 2022-01-23: qty 0.25

## 2022-01-23 MED ORDER — LORAZEPAM 2 MG/ML IJ SOLN
INTRAMUSCULAR | Status: AC
  Administered 2022-01-23: 0.5 mg via INTRAVENOUS
  Filled 2022-01-23: qty 1

## 2022-01-24 ENCOUNTER — Encounter: Payer: Self-pay | Admitting: Cardiology

## 2022-01-29 NOTE — Progress Notes (Unsigned)
Cardiology Office Note:    Date:  01/30/2022   ID:  Amber Hurst, DOB Oct 10, 2000, MRN [id number]  PCP:  Chevis Pretty, FNP  Cardiologist:  None  Electrophysiologist:  None   Referring MD: Chevis Pretty, *   Chief Complaint  Patient presents with   Follow-up   Palpitations   History of Present Illness:    Amber Hurst is a 21 y.o. female with a hx of ADD who presents for follow-up.  She was seen in the ED on 09/28/2021 for labile heart rate and blood pressure.  Work-up unremarkable, she was referred to cardiology for evaluation.  She reports has been having palpitations about 1-2 times per week.  Feels like heart is racing, can last for hours.  She has been taking propranolol as needed.  She reports symptoms sometimes occur when she stands.  During episodes she reports feeling lightheaded and short of breath.  Denies any syncope.  Can have chest pain during episodes.  She reports shortness of breath with walking up stairs but denies any chest pain.  She does not exercise.  She has vaped since age 69.  Drinks 1 caffeinated soda per day.  She stopped drinking alcohol since symptoms started, previously would drink 1 day/week but would drink heavily that day, may have up to 12 drinks.  No known family history of heart disease.  Echocardiogram 10/31/2021 showed normal biventricular function, normal diastolic function, no significant valvular disease, cannot rule out secundum ASD.  Cardiac MRI 01/23/2022 showed no ASD, Qp/Qs 1.0 indicating no significant shunt, normal biventricular size and systolic function.  ZIO monitor x11 days 11/09/2021 showed no significant arrhythmias.  Since last clinic visit, she reports she is doing okay.  She continues to have some high heart rates.  Continues to have palpitations, occurs once or twice per week and lasts for 30 minutes.  She denies any lightheadedness or syncope.  Does report some chest discomfort when her heart is beating fast.  She stopped vaping  and and pretty much stopped drinking alcohol as well.  Past Medical History:  Diagnosis Date   ADD (attention deficit disorder)    Environmental allergies    respiratory     No past surgical history on file.  Current Medications: Current Meds  Medication Sig   VENTOLIN HFA 108 (90 Base) MCG/ACT inhaler INHALE 2 PUFFS BY MOUTH EVERY 6 HOURS AS NEEDED FOR WHEEZING OR SHORTNESS OF BREATH     Allergies:   Amoxicillin and Chocolate flavor   Social History   Socioeconomic History   Marital status: Significant Other    Spouse name: Not on file   Number of children: Not on file   Years of education: Not on file   Highest education level: Not on file  Occupational History   Not on file  Tobacco Use   Smoking status: Never   Smokeless tobacco: Never  Vaping Use   Vaping Use: Every day  Substance and Sexual Activity   Alcohol use: Yes   Drug use: No   Sexual activity: Yes    Birth control/protection: None  Other Topics Concern   Not on file  Social History Narrative   Not on file   Social Determinants of Health   Financial Resource Strain: Low Risk  (06/06/2020)   Overall Financial Resource Strain (CARDIA)    Difficulty of Paying Living Expenses: Not very hard  Food Insecurity: No Food Insecurity (06/06/2020)   Hunger Vital Sign    Worried About Running Out of Food  in the Last Year: Never true    Ran Out of Food in the Last Year: Never true  Transportation Needs: No Transportation Needs (06/06/2020)   PRAPARE - Administrator, Civil Service (Medical): No    Lack of Transportation (Non-Medical): No  Physical Activity: Insufficiently Active (06/06/2020)   Exercise Vital Sign    Days of Exercise per Week: 2 days    Minutes of Exercise per Session: 10 min  Stress: No Stress Concern Present (06/06/2020)   [employer] of Occupational Health - Occupational Stress Questionnaire    Feeling of Stress : Only a little  Social Connections: Socially Isolated (06/06/2020)    Social Connection and Isolation Panel [NHANES]    Frequency of Communication with Friends and Family: Once a week    Frequency of Social Gatherings with Friends and Family: Once a week    Attends Religious Services: Never    Database administrator or Organizations: No    Attends Engineer, structural: Never    Marital Status: Never married     Family History: The patient's family history includes Hypertension in her father.  ROS:   Please see the history of present illness.     All other systems reviewed and are negative.  EKGs/Labs/Other Studies Reviewed:    The following studies were reviewed today:   EKG:   01/30/22: Normal sinus rhythm, Q waves V1/2, no ST abnormalities 10/11/2021: Normal sinus rhythm, rate 88, Q waves in V1/2, no ST abnormalities  Recent Labs: 09/28/2021: ALT 17; BUN 11; Creatinine, Ser 0.77; Hemoglobin 13.3; Platelets 386; Potassium 4.0; Sodium 136 10/31/2021: TSH 1.520  Recent Lipid Panel No results found for: "CHOL", "TRIG", "HDL", "CHOLHDL", "VLDL", "LDLCALC", "LDLDIRECT"  Physical Exam:    VS:  BP 132/88 (BP Location: Left Arm, Patient Position: Sitting, Cuff Size: Large)   Pulse 72   Ht 5\' 2"  (1.575 m)   Wt 192 lb 9.6 oz (87.4 kg)   SpO2 99%   BMI 35.23 kg/m     Wt Readings from Last 3 Encounters:  01/30/22 192 lb 9.6 oz (87.4 kg)  10/11/21 190 lb 9.6 oz (86.5 kg)  11/29/20 190 lb (86.2 kg)     GEN:  Well nourished, well developed in no acute distress HEENT: Normal NECK: No JVD; No carotid bruits LYMPHATICS: No lymphadenopathy CARDIAC: RRR, no murmurs, rubs, gallops RESPIRATORY:  Clear to auscultation without rales, wheezing or rhonchi  ABDOMEN: Soft, non-tender, non-distended MUSCULOSKELETAL:  No edema; No deformity  SKIN: Warm and dry NEUROLOGIC:  Alert and oriented x 3 PSYCHIATRIC:  Normal affect   ASSESSMENT:    1. Palpitations   2. Orthostatic hypotension   3. Vaping nicotine dependence, tobacco product     PLAN:     Palpitations: Echocardiogram 10/31/2021 showed normal biventricular function, normal diastolic function, no significant valvular disease, cannot rule out secundum ASD.  Cardiac MRI 01/23/2022 showed no ASD, Qp/Qs 1.0 indicating no significant shunt, normal biventricular size and systolic function.  ZIO monitor x11 days 11/09/2021 showed no significant arrhythmias.  Orthostatic hypotension: BP dropped from 129/81 lying to 100/68 standing at initial appointment, heart rate increased from 82-92.  Suspect she was dehydrated.  Orthostatics in clinic today were normal.  Encouraged to stay well-hydrated  Vaping: Congratulated patient on quitting and encouraged continued cessation  Alcohol use: Congratulated patient on significantly cutting back on alcohol use, reports does not drink alcohol at all most weeks  RTC as needed   Medication Adjustments/Labs and Tests  Ordered: Current medicines are reviewed at length with the patient today.  Concerns regarding medicines are outlined above.  Orders Placed This Encounter  Procedures   EKG 12-Lead   No orders of the defined types were placed in this encounter.   Patient Instructions  Medication Instructions:  Your physician recommends that you continue on your current medications as directed. Please refer to the Current Medication list given to you today.  *If you need a refill on your cardiac medications before your next appointment, please call your pharmacy*  Follow-Up: At [facility], you and your health needs are our priority.  As part of our continuing mission to provide you with exceptional heart care, we have created designated Provider Care Teams.  These Care Teams include your primary Cardiologist (physician) and Advanced Practice Providers (APPs -  Physician Assistants and Nurse Practitioners) who all work together to provide you with the care you need, when you need it.  We recommend signing up for the patient portal called  "MyChart".  Sign up information is provided on this After Visit Summary.  MyChart is used to connect with patients for Virtual Visits (Telemedicine).  Patients are able to view lab/test results, encounter notes, upcoming appointments, etc.  Non-urgent messages can be sent to your provider as well.   To learn more about what you can do with MyChart, go to [email or web address].    Your next appointment:   AS NEEDED with Dr. Bjorn Pippin         Signed, Little Ishikawa, MD  01/30/2022 4:51 PM    [facility]

## 2022-01-30 ENCOUNTER — Encounter: Payer: Self-pay | Admitting: Cardiology

## 2022-01-30 ENCOUNTER — Ambulatory Visit: Payer: Self-pay | Attending: Cardiology | Admitting: Cardiology

## 2022-01-30 VITALS — BP 132/88 | HR 72 | Ht 62.0 in | Wt 192.6 lb

## 2022-01-30 DIAGNOSIS — I951 Orthostatic hypotension: Secondary | ICD-10-CM

## 2022-01-30 DIAGNOSIS — F17290 Nicotine dependence, other tobacco product, uncomplicated: Secondary | ICD-10-CM

## 2022-01-30 DIAGNOSIS — R002 Palpitations: Secondary | ICD-10-CM

## 2022-01-30 NOTE — Patient Instructions (Signed)
Medication Instructions:  Your physician recommends that you continue on your current medications as directed. Please refer to the Current Medication list given to you today.  *If you need a refill on your cardiac medications before your next appointment, please call your pharmacy*  Follow-Up: At [facility], you and your health needs are our priority.  As part of our continuing mission to provide you with exceptional heart care, we have created designated Provider Care Teams.  These Care Teams include your primary Cardiologist (physician) and Advanced Practice Providers (APPs -  Physician Assistants and Nurse Practitioners) who all work together to provide you with the care you need, when you need it.  We recommend signing up for the patient portal called "MyChart".  Sign up information is provided on this After Visit Summary.  MyChart is used to connect with patients for Virtual Visits (Telemedicine).  Patients are able to view lab/test results, encounter notes, upcoming appointments, etc.  Non-urgent messages can be sent to your provider as well.   To learn more about what you can do with MyChart, go to [email or web address].    Your next appointment:   AS NEEDED with Dr. Gardiner Rhyme

## 2022-01-31 ENCOUNTER — Telehealth: Payer: Self-pay | Admitting: Licensed Clinical Social Worker

## 2022-01-31 NOTE — Progress Notes (Signed)
Heart and Vascular Care Navigation  01/31/2022  Amber Hurst [birth year] [id number]  Reason for Referral:  Patient is participating in a Managed Medicaid Plan: No  Engaged with patient by telephone for initial visit for Heart and Vascular Care Coordination.                                                                                                   Assessment:             LCSW spoke with pt via telephone. Introduced self, role, reason for call. Pt confirmed address listed is her mailing address, she lives elsewhere currently with her significant other. She is employed in a call center and previously had Medicaid but was told she now makes too much and her benefits were cancelled. She was able to enroll in benefits with her job but they will not begin until next year/later this year. We discussed how the [employer] works and which documents to gather. Pt shares she was told to speak with her PCP about her medications but hasn't done that yet. Denies any current additional financial challenges. Encouraged her to let us know if we can be of any assistance to complete and submit the assistance applications.                              HRT/VAS Care Coordination     Patients Home Cardiology Office [facility]   Outpatient Care Team Social Worker   Social Worker Name: Nile Riggs, [place] [facility] [phone]   Living arrangements for the past 2 months Single Family Home   Lives with: Significant Other   Patient Current Insurance Coverage Self-Pay   Patient Has Concern With Paying Medical Bills Yes   Patient Concerns With Medical Bills currently in a gap of being uninsured until alternate coverage begins   Medical Bill Referrals: [employer]FA   Does Patient Have Prescription Coverage? No       Social History:                                                                             SDOH Screenings   Food Insecurity: No Food Insecurity (01/31/2022)  Housing:  Low Risk  (01/31/2022)  Transportation Needs: No Transportation Needs (01/31/2022)  Alcohol Screen: Low Risk  (06/06/2020)  Depression (PHQ2-9): Low Risk  (01/19/2021)  Recent Concern: Depression (PHQ2-9) - Medium Risk (11/29/2020)  Financial Resource Strain: Low Risk  (01/31/2022)  Physical Activity: Insufficiently Active (06/06/2020)  Social Connections: Socially Isolated (06/06/2020)  Stress: No Stress Concern Present (06/06/2020)  Tobacco Use: Low Risk  (01/30/2022)    SDOH Interventions: Financial Resources:  Financial Strain Interventions: Other (Comment) (provided CAFA to assist with bills accrued during period of being uninsured) Financial Counseling  for [employer] [facility]ount Program  Food Insecurity:  Food Insecurity Interventions: Intervention Not Indicated  Housing Insecurity:  Housing Interventions: Intervention Not Indicated  Transportation:   Transportation Interventions: Intervention Not Indicated    Other Care Navigation Interventions:     Provided Pharmacy assistance resources  Pt states she is not currently taking any medications until speaking with PCP about continuing them   Follow-up plan:   LCSW mailed pt a copy of the [employer]  started for her, with my card for any additional questions/concerns that may arise. I remain available if needed.

## 2022-03-04 ENCOUNTER — Encounter: Payer: Self-pay | Admitting: Cardiology

## 2022-03-05 NOTE — Telephone Encounter (Signed)
Agree with plan, would check blood pressure when having the symptoms

## 2022-04-26 ENCOUNTER — Telehealth: Payer: Self-pay | Admitting: Nurse Practitioner

## 2022-05-03 ENCOUNTER — Other Ambulatory Visit: Payer: Self-pay | Admitting: Family Medicine

## 2022-05-03 DIAGNOSIS — R062 Wheezing: Secondary | ICD-10-CM

## 2022-05-06 MED ORDER — VENTOLIN HFA 108 (90 BASE) MCG/ACT IN AERS: 2.0000 | INHALATION_SPRAY | Freq: Four times a day (QID) | RESPIRATORY_TRACT | 0 refills | Status: AC | PRN

## 2022-06-13 ENCOUNTER — Ambulatory Visit: Payer: Self-pay | Admitting: Nurse Practitioner

## 2022-06-18 ENCOUNTER — Ambulatory Visit (INDEPENDENT_AMBULATORY_CARE_PROVIDER_SITE_OTHER): Payer: Self-pay | Admitting: Nurse Practitioner

## 2022-06-18 ENCOUNTER — Encounter: Payer: Self-pay | Admitting: Nurse Practitioner

## 2022-06-18 VITALS — BP 136/82 | HR 69 | Temp 97.6°F | Resp 20 | Ht 62.4 in | Wt 195.2 lb

## 2022-06-18 DIAGNOSIS — M7701 Medial epicondylitis, right elbow: Secondary | ICD-10-CM

## 2022-06-18 DIAGNOSIS — M778 Other enthesopathies, not elsewhere classified: Secondary | ICD-10-CM

## 2022-06-18 NOTE — Progress Notes (Signed)
Subjective:    Patient ID: Amber Hurst, female    DOB: 01-10-2001, 22 y.o.   MRN: [id number]   Chief Complaint: Numbess in hand (+)   HPI  Patinet in c/o right wrist and elbow pain. Pain radiates down into fingers. Hurst when she flexes wrist back. Rates pain [id number] certain movement. 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  Eyes:  Negative for pain.  Respiratory:  Negative for shortness of breath.   Cardiovascular:  Negative for chest pain, palpitations and leg swelling.  Gastrointestinal:  Negative for abdominal pain.  Endocrine: Negative for polydipsia.  Skin:  Negative for rash.  Neurological:  Negative for dizziness, weakness and headaches.  Hematological:  Does not bruise/bleed easily.  All other systems reviewed and are negative.      Objective:   Physical Exam Vitals and nursing note reviewed.  Constitutional:      General: She is not in acute distress.    Appearance: Normal appearance. She is well-developed.  Neck:     Vascular: No carotid bruit or JVD.  Cardiovascular:     Rate and Rhythm: Normal rate and regular rhythm.     Heart sounds: Normal heart sounds.  Pulmonary:     Effort: Pulmonary effort is normal. No respiratory distress.     Breath sounds: Normal breath sounds. No wheezing or rales.  Chest:     Chest wall: No tenderness.  Abdominal:     General: There is no distension or abdominal bruit.     Palpations: There is no hepatomegaly, splenomegaly, mass or pulsatile mass.     Tenderness: There is no abdominal tenderness.  Musculoskeletal:        General: Normal range of motion.     Cervical back: Normal range of motion and neck supple.     Comments: Right wrist pain on palpation Pain medial aspect of right wrist    Lymphadenopathy:     Cervical: No cervical adenopathy.  Skin:    General: Skin is warm and dry.  Neurological:     Mental Status: She is alert and oriented to person, place, and time.     Deep Tendon Reflexes: Reflexes are normal and symmetric.  Psychiatric:        Behavior: Behavior normal.        Thought Content: Thought content normal.        Judgment: Judgment normal.    BP 136/82   Pulse 69   Temp 97.6 F (36.4 C) (Temporal)   Resp 20   Ht 5' 2.4" (1.585 m)   Wt 195 lb 3.2 oz (88.5 kg)   SpO2 97%   BMI 35.25 kg/m         Assessment & Plan:   Amber Hurst in today with chief complaint of Numbess in hand (+)   1. Right wrist tendonitis Wrist splint  2. Golfer's elbow, right Tennis elbow strap  Motrin 600mg  2x a day Moist heat rest    The above assessment and management plan was discussed with the patient. The patient verbalized understanding of and has agreed to the management plan. Patient is aware to call the clinic if symptoms persist or worsen. Patient is aware when to return to the clinic for a follow-up visit. Patient educated on when it is  appropriate to go to the emergency department.   Mary-Margaret Hassell Done, FNP

## 2022-06-18 NOTE — Patient Instructions (Signed)
Tendinitis  Tendinitis is inflammation of a tendon. A tendon is a strong cord of tissue that connects muscle to bone. Tendinitis can affect any tendon, but it most commonly affects the: Shoulder tendon (biceps tendon or rotator cuff). Ankle tendon (Achilles tendon). Elbow tendons. Tendons in the wrist. What are the causes? This condition may be caused by: Overusing a tendon or muscle. This is the most common cause. Age-related wear and tear. Injury. Inflammatory conditions, such as arthritis. Certain medicines. What increases the risk? You are more likely to develop this condition if you do activities that involve the same movements over and over again (repetitive motions). What are the signs or symptoms? Symptoms of this condition may include: Pain. Tenderness. Mild swelling. Decreased range of motion. How is this diagnosed? This condition is diagnosed with a physical exam. You may also have tests, such as: Ultrasound. This uses sound waves to make an image of the inside of your body in the affected area. MRI. This uses magnetic fields and radio waves to make an image of the inside of your body in the affected area. How is this treated? This condition may be treated by resting, icing, applying pressure (compression), and raising (elevating) the affected area above the level of your heart. This is known as RICE therapy. Treatment may also include: Medicines to help reduce inflammation or to help reduce pain. Exercises or physical therapy to improve movement and strength of the tendon. A brace or splint. Injection of corticosteroid medicine. This may be done in some cases. Surgery. This is rarely needed and only used if all other treatment has failed. Follow these instructions at home: If you have a removable splint or brace: Wear the splint or brace as told by your health care provider. Remove it only as told by your health care provider. Check the skin around the splint or brace  every day. Tell your health care provider about any concerns. Loosen the splint or brace if your fingers or toes tingle, become numb, or turn cold and blue. Keep the splint or brace clean. If the splint or brace is not waterproof: Do not let it get wet. Cover it with a watertight covering when you take a bath or shower, or remove it as told by your health care provider. Managing pain, stiffness, and swelling     If directed, put ice on the affected area. To do this: If you have a removable splint or brace, remove it as told by your health care provider. Put ice in a plastic bag. Place a towel between your skin and the bag. Leave the ice on for 20 minutes, 2-3 times a day. Remove the ice if your skin turns bright red. This is very important. If you cannot feel pain, heat, or cold, you have a greater risk of damage to the area. Move the fingers or toes of the affected limb often, if this applies. This can help to reduce stiffness and swelling. If directed, elevate the affected area above the level of your heart while you are sitting or lying down. If directed, apply heat to the affected area before you exercise. Use the heat source that your health care provider recommends, such as a moist heat pack or a heating pad. Place a towel between your skin and the heat source. Leave the heat on for 20-30 minutes. Remove the heat if your skin turns bright red. This is especially important if you are unable to feel pain, heat, or cold. You have a   greater risk of getting burned. Activity Rest the affected area as told by your health care provider. Ask your health care provider when it is safe to drive if you have a splint or brace on any part of your arm or leg. Return to your normal activities as told by your health care provider. Ask your health care provider what activities are safe for you. Avoid using the affected area while you are experiencing symptoms of tendinitis. Do exercises as told by your  health care provider. General instructions Wear an elastic bandage or compression wrap only as told by your health care provider. Take over-the-counter and prescription medicines only as told by your health care provider. Keep all follow-up visits. This is important. Contact a health care provider if: Your symptoms do not improve. You develop new, unexplained problems, such as numbness in your hands or feet. Summary Tendinitis is inflammation of a tendon. You are more likely to develop this condition if you do activities that involve the same movements over and over again. This condition may be treated by resting, icing, applying pressure (compression), and elevating the area above the level of your heart. This is known as RICE therapy. Avoid using the affected area while you are experiencing symptoms of tendinitis. This information is not intended to replace advice given to you by your health care provider. Make sure you discuss any questions you have with your health care provider. Document Revised: 11/23/2020 Document Reviewed: 11/23/2020 [employer] Patient Education  [place] Elbow  Golfer's elbow (medial epicondylitis) is a condition that results from inflammation of the strong bands of tissue (tendons) that attach your forearm muscles to the inside of your bone at the elbow. These tendons affect the muscles that bend the palm toward the wrist (flexion). The tendons become less flexible with age. This condition is called golfer's elbow because it is more common among people who constantly bend and twist their wrists, such as golfers. This injury is usually caused by repeated use of the same muscles. What are the causes? This condition is caused by: Repeatedly flexing, turning, or twisting your wrist. Frequently gripping objects with your hands. Sudden injury. What increases the risk? This condition is more likely to develop in people who play golf, baseball, or  tennis. This injury is more common among people who have jobs that require the constant use of their hands, such as: People who use computers. Carpenters. Butchers. Musicians. What are the signs or symptoms? This condition causes elbow pain that may spread to your forearm and upper arm. Symptoms of this condition include: Pain at the inner elbow, forearm, or wrist. A weak grip in the hand. The pain may get worse when you bend your wrist downward. How is this diagnosed? This condition is diagnosed based on your symptoms, your medical history, and a physical exam. During the exam, your health care provider may: Test your grip strength. Move your wrist to check for pain. You may also have an MRI to: Confirm the diagnosis. Look for other issues. Check for tears in the ligaments, muscles, or tendons. How is this treated? Treatment for this condition includes: Stopping all activities that make you bend or twist your elbow or wrist and waiting until your pain and other symptoms go away before resuming those activities. Wearing an elbow brace or wrist splint to restrict the movements that cause symptoms. Icing your inner elbow, forearm, or wrist to relieve pain. Taking NSAIDs, such as ibuprofen, or getting corticosteroid injections to  reduce pain and swelling. Doing stretching, range-of-motion, and strengthening exercises (physical therapy) as told by your health care provider. In rare cases, surgery may be needed if your condition does not improve. Follow these instructions at home: If you have a brace or splint: Wear the brace or splint as told by your health care provider. Remove it only as told by your health care provider. Check the skin around the brace or splint every day. Tell your health care provider about any concerns. Loosen the brace or splint if your fingers tingle, become numb, or turn cold and blue. Keep it clean. If the brace or splint is not waterproof: Do not let it get  wet. Cover it with a watertight covering when you take a bath or shower. Managing pain, stiffness, and swelling  If directed, put ice on the injured area. To do this: If you have a removable brace or splint, remove it as told by your health care provider. Put ice in a plastic bag. Place a towel between your skin and the bag. Leave the ice on for 20 minutes, 2-3 times a day. Remove the ice if your skin turns bright red. This is very important. If you cannot feel pain, heat, or cold, you have a greater risk of damage to the area. Move your fingers often to avoid stiffness and swelling. Activity Rest your injured area as told by your health care provider. Return to your normal activities as told by your health care provider. Ask your health care provider what activities are safe for you. Do exercises as told by your health care provider. Lifestyle If your condition is caused by sports, work with a trainer to make sure that you: Use the correct technique. Use the proper equipment. If your condition is work related, talk with your employer about ways to manage your condition at work. General instructions Take over-the-counter and prescription medicines only as told by your health care provider. Do not use any products that contain nicotine or tobacco. These products include cigarettes, chewing tobacco, and vaping devices, such as e-cigarettes. If you need help quitting, ask your health care provider. Keep all follow-up visits. This is important. How is this prevented? Before and after activity: Warm up and stretch before being active. Cool down and stretch after being active. Give your body time to rest between periods of activity. During activity: Make sure to use equipment that fits you. If you play golf, slow your golf swing to reduce shock in the arm when making contact with the ball. Maintain physical fitness, including: Strength. Flexibility. Endurance. Do exercises to strengthen  the forearm muscles. Contact a health care provider if: Your pain does not improve or it gets worse. You notice numbness in your hand. Get help right away if: Your pain is severe. You cannot move your wrist. Summary Golfer's elbow, also called medial epicondylitis, is a condition that results from inflammation of the strong bands of tissue (tendons) that attach your forearm muscles to the inside of your bone at the elbow. This injury usually results from overuse. Symptoms of this condition include decreased grip strength and pain at the inner elbow, forearm, or wrist. This injury is treated with rest, a brace or splint, ice, medicines, physical therapy, and surgery as needed. This information is not intended to replace advice given to you by your health care provider. Make sure you discuss any questions you have with your health care provider. Document Revised: 09/28/2019 Document Reviewed: 09/28/2019 Elsevier Patient Education  2023  [employer].

## 2022-06-25 ENCOUNTER — Ambulatory Visit: Payer: Self-pay | Admitting: Nurse Practitioner

## 2023-05-21 ENCOUNTER — Encounter (HOSPITAL_COMMUNITY): Payer: Self-pay | Admitting: Emergency Medicine

## 2023-05-21 ENCOUNTER — Other Ambulatory Visit: Payer: Self-pay

## 2023-05-21 ENCOUNTER — Emergency Department (HOSPITAL_COMMUNITY)
Admission: EM | Admit: 2023-05-21 | Discharge: 2023-05-21 | Disposition: A | Discharge status: Home / Self Care | Payer: Self-pay | Attending: Emergency Medicine | Admitting: Emergency Medicine

## 2023-05-21 ENCOUNTER — Emergency Department (HOSPITAL_COMMUNITY): Payer: Self-pay

## 2023-05-21 DIAGNOSIS — S61551A Open bite of right wrist, initial encounter: Secondary | ICD-10-CM | POA: Insufficient documentation

## 2023-05-21 DIAGNOSIS — W540XXA Bitten by dog, initial encounter: Secondary | ICD-10-CM | POA: Insufficient documentation

## 2023-05-21 DIAGNOSIS — S51852A Open bite of left forearm, initial encounter: Secondary | ICD-10-CM | POA: Insufficient documentation

## 2023-05-21 DIAGNOSIS — Z23 Encounter for immunization: Secondary | ICD-10-CM | POA: Insufficient documentation

## 2023-05-21 LAB — POC URINE PREG, ED: Preg Test, Ur: NEGATIVE

## 2023-05-21 MED ORDER — IBUPROFEN 600 MG PO TABS: 600.0000 mg | ORAL_TABLET | Freq: Four times a day (QID) | ORAL | 0 refills | Status: AC | PRN

## 2023-05-21 MED ORDER — LIDOCAINE HCL (PF) 1 % IJ SOLN
30.0000 mL | Freq: Once | INTRAMUSCULAR | Status: AC
  Administered 2023-05-21: 30 mL
  Filled 2023-05-21: qty 30

## 2023-05-21 MED ORDER — HYDROCODONE-ACETAMINOPHEN 5-325 MG PO TABS
1.0000 | ORAL_TABLET | Freq: Once | ORAL | Status: AC
  Administered 2023-05-21: 1 via ORAL
  Filled 2023-05-21: qty 1

## 2023-05-21 MED ORDER — KETOROLAC TROMETHAMINE 15 MG/ML IJ SOLN
15.0000 mg | Freq: Once | INTRAMUSCULAR | Status: DC
  Filled 2023-05-21: qty 1

## 2023-05-21 MED ORDER — DOXYCYCLINE HYCLATE 100 MG PO CAPS
100.0000 mg | ORAL_CAPSULE | Freq: Two times a day (BID) | ORAL | 0 refills | Status: AC
Start: 2023-05-21 — End: 2023-06-04

## 2023-05-21 MED ORDER — OXYCODONE-ACETAMINOPHEN 5-325 MG PO TABS: 1.0000 | ORAL_TABLET | Freq: Four times a day (QID) | ORAL | 0 refills | Status: DC | PRN

## 2023-05-21 MED ORDER — TETANUS-DIPHTH-ACELL PERTUSSIS 5-2.5-18.5 LF-MCG/0.5 IM SUSY
0.5000 mL | PREFILLED_SYRINGE | Freq: Once | INTRAMUSCULAR | Status: AC
  Administered 2023-05-21: 0.5 mL via INTRAMUSCULAR
  Filled 2023-05-21: qty 0.5

## 2023-05-21 MED ORDER — DOXYCYCLINE HYCLATE 100 MG PO TABS
100.0000 mg | ORAL_TABLET | Freq: Once | ORAL | Status: AC
  Administered 2023-05-21: 100 mg via ORAL
  Filled 2023-05-21: qty 1

## 2023-05-21 NOTE — ED Provider Notes (Signed)
 [place] EMERGENCY DEPARTMENT AT [facility] Provider Note  CSN: [id number] Arrival date & time: 05/21/23 1454  Chief Complaint(s) Animal Bite  HPI Amber Hurst is a 23 y.o. female history of ADHD presenting to the emergency department with dog bite.  Patient reports that her dog was upset by another dog and she was trying to separate the dog when her dog bit her in the right wrist, left forearm.  Reports pain.  No numbness or tingling.  Reports her dog was not vaccinated.  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 Authorizing Provider  doxycycline (VIBRAMYCIN) 100 MG capsule Take 1 capsule (100 mg total) by mouth 2 (two) times daily for 14 days. 05/21/23 06/04/23 Yes Lonell Grandchild, MD  ibuprofen (ADVIL) 600 MG tablet Take 1 tablet (600 mg total) by mouth every 6 (six) hours as needed. 05/21/23  Yes Lonell Grandchild, MD  oxyCODONE-acetaminophen (PERCOCET/ROXICET) 5-325 MG tablet Take 1 tablet by mouth every 6 (six) hours as needed for severe pain (pain score 7-10). 05/21/23  Yes Lonell Grandchild, MD  citalopram (CELEXA) 10 MG tablet Take 1 tablet (10 mg total) by mouth daily. 01/19/21   Daphine Deutscher Mary-Margaret, FNP  hydrOXYzine (ATARAX/VISTARIL) 10 MG tablet Take 1 tablet (10 mg total) by mouth 3 (three) times daily as needed for anxiety. 11/13/20   Gabriel Earing, FNP  VENTOLIN HFA 108 (90 Base) MCG/ACT inhaler Inhale 2 puffs into the lungs every 6 (six) hours as needed for wheezing or shortness of breath. 05/06/22   Bennie Pierini, FNP                                                                                                                                     Past Surgical History History reviewed. No pertinent surgical history. Family History Family History  Problem Relation Age of Onset   Hypertension Father     Social History Social History   Tobacco Use   Smoking status: Never   Smokeless tobacco: Never  Vaping Use   Vaping status: Every Day   Substances: Nicotine, Flavoring  Substance Use Topics   Alcohol use: Yes   Drug use: No   Allergies Amoxicillin and Chocolate flavoring agent (non-screening)  Review of Systems Review of Systems  All other systems reviewed and are negative.   Physical Exam Vital Signs  I have reviewed the triage vital signs BP 139/82 (BP Location: Right Arm)   Pulse (!) 105   Temp 98.6 F (37 C) (  Oral)   Resp 16   Ht 5\' 2"  (1.575 m)   Wt 85.7 kg   LMP 04/21/2023 (Approximate)   SpO2 99%   BMI 34.57 kg/m  Physical Exam Vitals and nursing note reviewed.  Constitutional:      Appearance: Normal appearance.  HENT:     Head: Normocephalic and atraumatic.     Mouth/Throat:     Mouth: Mucous membranes are moist.  Eyes:     Conjunctiva/sclera: Conjunctivae normal.  Cardiovascular:     Rate and Rhythm: Normal rate.  Pulmonary:     Effort: Pulmonary effort is normal. No respiratory distress.  Abdominal:     General: Abdomen is flat.  Musculoskeletal:        General: No deformity.     Comments: Right hand and left hand both with intact flexion, extension of all fingers with no sensory deficit intact capillary refill.  Skin:    General: Skin is warm and dry.     Capillary Refill: Capillary refill takes less than 2 seconds.     Comments: Approximately 4 cm L-shaped wound over the volar wrist and palm on the little finger aspect.  2 separate approximately 2 cm and 1 cm lacerations that dorsal left forearm  Neurological:     General: No focal deficit present.      Mental Status: She is alert. Mental status is at baseline.  Psychiatric:        Mood and Affect: Mood normal.        Behavior: Behavior normal.     ED Results and Treatments Labs (all labs ordered are listed, but only abnormal results are displayed) Labs Reviewed  POC URINE PREG, ED                                                                                                                          Radiology DG Forearm Left Result Date: 05/21/2023 CLINICAL DATA:  Dog bite. EXAM: LEFT FOREARM - 2 VIEW COMPARISON:  None Available. FINDINGS: There is no evidence of fracture or other focal bone lesions. Wrist and elbow alignment are maintained. Edema and air in the soft tissues of the mid forearm. No radiopaque foreign body. IMPRESSION: Soft tissue injury with air and edema. No fracture or radiopaque foreign body. Electronically Signed   By: Narda Rutherford M.D.   On: 05/21/2023 15:52   DG Hand Complete Right Result Date: 05/21/2023 CLINICAL DATA:  Dog bite.  Puncture wound/laceration. EXAM: RIGHT HAND - COMPLETE 3+ VIEW COMPARISON:  None Available. FINDINGS: There is no evidence of fracture or dislocation. There is no evidence of arthropathy or other focal bone abnormality. Edema and soft tissue gas in the ulnar aspect of the hand adjacent to the proximal fifth metacarpal and carpal bones. No radiopaque foreign body. IMPRESSION: Soft tissue injury.  No radiopaque foreign body or acute fracture. Electronically Signed   By: Narda Rutherford M.D.   On: 05/21/2023 15:51    Pertinent labs & imaging  results that were available during my care of the patient were reviewed by me and considered in my medical decision making (see MDM for details).  Medications Ordered in ED Medications  HYDROcodone-acetaminophen (NORCO/VICODIN) 5-325 MG per tablet 1 tablet (has no administration in time range)  ketorolac (TORADOL) 15 MG/ML injection 15 mg (has no administration in time range)   HYDROcodone-acetaminophen (NORCO/VICODIN) 5-325 MG per tablet 1 tablet (1 tablet Oral Given 05/21/23 1553)  Tdap (BOOSTRIX) injection 0.5 mL (0.5 mLs Intramuscular Given 05/21/23 1553)  lidocaine (PF) (XYLOCAINE) 1 % injection 30 mL (30 mLs Other Given 05/21/23 1907)  doxycycline (VIBRA-TABS) tablet 100 mg (100 mg Oral Given 05/21/23 2045)                                                                                                                                     Procedures .Laceration Repair  Date/Time: 05/21/2023 8:41 PM  Performed by: Lonell Grandchild, MD Authorized by: Lonell Grandchild, MD   Consent:    Consent obtained:  Verbal   Consent given by:  Patient   Risks, benefits, and alternatives were discussed: yes     Risks discussed:  Infection, need for additional repair, nerve damage, pain, vascular damage, poor wound healing, poor cosmetic result, retained foreign body and tendon damage   Alternatives discussed:  No treatment Universal protocol:    Patient identity confirmed:  Verbally with patient and arm band Anesthesia:    Anesthesia method:  Local infiltration   Local anesthetic:  Lidocaine 1% w/o epi Laceration details:    Location:  Hand   Hand location:  R palm   Length (cm):  4 Pre-procedure details:    Preparation:  Imaging obtained to evaluate for foreign bodies Exploration:    Imaging obtained: x-ray     Imaging outcome: foreign body not noted     Wound exploration: wound explored through full range of motion and entire depth of wound visualized     Wound extent: areolar tissue not violated, fascia not violated, no foreign body, no signs of injury, no nerve damage, no tendon damage, no underlying fracture and no vascular damage     Contaminated: no   Treatment:    Area cleansed with:  Saline   Amount of cleaning:  Extensive   Irrigation solution:  Sterile saline   Irrigation method:  Pressure wash   Visualized foreign bodies/material removed: no      Debridement:  None   Undermining:  None   Scar revision: no   Skin repair:    Repair method:  Sutures   Suture size:  3-0   Suture material:  Prolene   Suture technique:  Simple interrupted   Number of sutures:  15 Approximation:    Approximation:  Close Repair type:    Repair type:  Complex Post-procedure details:    Dressing:  Non-adherent dressing   Procedure completion:  Tolerated well, no  immediate complications .Laceration Repair  Date/Time: 05/21/2023 8:42 PM  Performed by: Lonell Grandchild, MD Authorized by: Lonell Grandchild, MD   Consent:    Consent obtained:  Verbal   Consent given by:  Patient   Risks, benefits, and alternatives were discussed: yes     Risks discussed:  Infection, need for additional repair, nerve damage, pain, vascular damage, poor wound healing, poor cosmetic result, retained foreign body and tendon damage   Alternatives discussed:  No treatment Universal protocol:    Patient identity confirmed:  Verbally with patient and arm band Laceration details:    Location:  Shoulder/arm   Shoulder/arm location:  L lower arm   Length (cm):  1 Pre-procedure details:    Preparation:  Imaging obtained to evaluate for foreign bodies Exploration:    Limited defect created (wound extended): no     Imaging obtained: x-ray     Imaging outcome: foreign body not noted     Wound exploration: wound explored through full range of motion and entire depth of wound visualized     Wound extent: areolar tissue not violated, fascia not violated, no foreign body, no signs of injury, no nerve damage, no tendon damage, no underlying fracture and no vascular damage     Contaminated: no   Treatment:    Area cleansed with:  Saline   Amount of cleaning:  Standard   Irrigation solution:  Sterile saline   Irrigation method:  Pressure wash   Visualized foreign bodies/material removed: no     Debridement:  None   Undermining:  None   Scar revision: no   Skin repair:     Repair method:  Sutures   Suture size:  3-0   Suture material:  Prolene   Suture technique:  Simple interrupted   Number of sutures:  3 Approximation:    Approximation:  Close Repair type:    Repair type:  Simple Post-procedure details:    Dressing:  Non-adherent dressing   Procedure completion:  Tolerated well, no immediate complications .Laceration Repair  Date/Time: 05/21/2023 8:43 PM  Performed by: Lonell Grandchild, MD Authorized by: Lonell Grandchild, MD   Consent:    Consent obtained:  Verbal   Consent given by:  Patient   Risks, benefits, and alternatives were discussed: yes     Risks discussed:  Infection, need for additional repair, nerve damage, pain, vascular damage, poor wound healing, poor cosmetic result, retained foreign body and tendon damage   Alternatives discussed:  No treatment Universal protocol:    Patient identity confirmed:  Verbally with patient and arm band Anesthesia:    Anesthesia method:  Local infiltration   Local anesthetic:  Lidocaine 1% w/o epi Laceration details:    Location:  Shoulder/arm   Shoulder/arm location:  L lower arm   Length (cm):  2 Pre-procedure details:    Preparation:  Imaging obtained to evaluate for foreign bodies and patient was prepped and draped in usual sterile fashion Exploration:    Limited defect created (wound extended): no     Imaging obtained: x-ray     Imaging outcome: foreign body not noted     Wound exploration: wound explored through full range of motion and entire depth of wound visualized     Wound extent: areolar tissue not violated, fascia not violated, no foreign body, no signs of injury, no nerve damage, no tendon damage, no underlying fracture and no vascular damage     Contaminated: no   Treatment:  Area cleansed with:  Saline   Amount of cleaning:  Standard   Irrigation solution:  Sterile saline   Debridement:  None   Undermining:  None   Scar revision: no   Skin repair:    Repair method:   Sutures   Suture size:  3-0   Suture material:  Prolene   Suture technique:  Simple interrupted   Number of sutures:  3 Approximation:    Approximation:  Close Repair type:    Repair type:  Simple Post-procedure details:    Dressing:  Non-adherent dressing   (including critical care time)  Medical Decision Making / ED Course   MDM:  23 year old presenting with dog bite.  Patient has 3 wounds, 1 to right wrist/palm and 2 to left dorsal forearm.  Will irrigate thoroughly, wounds are relatively gaping so we will repair.  No evidence of any neurovascular injury, tendon injury.  Will be high risk for infection.  Will prescribe antibiotics.  Tetanus has been updated.  Will ask nursing to notify animal control.  Patient reports her dog is not vaccinated, but given that it is a domestic dog and can be observed I do not think the patient needs a rabies vaccination at this time.  X-rays were negative for any foreign body.  Will explore thoroughly after irrigation.  Clinical Course as of 05/21/23 2045  Wed May 21, 2023  2044 Wound was thoroughly irrigated by nursing.  Lacerations were repaired.  Please see procedure note for further details.  Will dressed with nonadhesive dressing, Xeroform gauze.  Will prescribe antibiotics.  Patient is allergic to amoxicillin so we will prescribe doxycycline.  Discussed strict ER precautions for any signs of infection and discussed that these bites are very high risk for infection. Will discharge patient to home. All questions answered. Patient comfortable with plan of discharge. Return precautions discussed with patient and specified on the after visit summary.  [WS]    Clinical Course User Index [WS] Lonell Grandchild, MD     Additional history obtained: -Additional history obtained from spouse   Lab Tests: -I ordered, reviewed, and interpreted labs.   The pertinent results include:   Labs Reviewed  POC URINE PREG, ED    Notable for not  pregnant    Imaging Studies ordered: I ordered imaging studies including XR  On my interpretation imaging demonstrates no foreign body I independently visualized and interpreted imaging. I agree with the radiologist interpretation   Medicines ordered and prescription drug management: Meds ordered this encounter  Medications   HYDROcodone-acetaminophen (NORCO/VICODIN) 5-325 MG per tablet 1 tablet    Refill:  0   Tdap (BOOSTRIX) injection 0.5 mL   lidocaine (PF) (XYLOCAINE) 1 % injection 30 mL   HYDROcodone-acetaminophen (NORCO/VICODIN) 5-325 MG per tablet 1 tablet    Refill:  0   ketorolac (TORADOL) 15 MG/ML injection 15 mg   doxycycline (VIBRA-TABS) tablet 100 mg   doxycycline (VIBRAMYCIN) 100 MG capsule    Sig: Take 1 capsule (100 mg total) by mouth 2 (two) times daily for 14 days.    Dispense:  28 capsule    Refill:  0   ibuprofen (ADVIL) 600 MG tablet    Sig: Take 1 tablet (600 mg total) by mouth every 6 (six) hours as needed.    Dispense:  30 tablet    Refill:  0   oxyCODONE-acetaminophen (PERCOCET/ROXICET) 5-325 MG tablet    Sig: Take 1 tablet by mouth every 6 (six) hours as needed for severe  pain (pain score 7-10).    Dispense:  15 tablet    Refill:  0    -I have reviewed the patients home medicines and have made adjustments as needed  Social Determinants of Health:  Diagnosis or treatment significantly limited by social determinants of health: obesity   Reevaluation: After the interventions noted above, I reevaluated the patient and found that their symptoms have improved  Co morbidities that complicate the patient evaluation  Past Medical History:  Diagnosis Date   ADD (attention deficit disorder)    Environmental allergies    respiratory       Dispostion: Disposition decision including need for hospitalization was considered, and patient discharged from emergency department.    Final Clinical Impression(s) / ED Diagnoses Final diagnoses:  Dog bite,  initial encounter     This chart was dictated using voice recognition software.  Despite best efforts to proofread,  errors can occur which can change the documentation meaning.    Lonell Grandchild, MD 05/21/23 2045

## 2023-05-21 NOTE — ED Provider Triage Note (Signed)
 Emergency Medicine Provider Triage Evaluation Note  Amber Hurst , a 23 y.o. female  was evaluated in triage.  Pt complains of dog bites to her left forearm and right hand when she attempted to break up a fight between 2 of her dogs.  The dog that bit her she has had for approximately 1 year, it is a rescue dog but is not vaccinated including rabies.  The dog is indoors at night but spends most of its days outdoors in a kennel.  She endorses significant pain at the site of her injuries.  Review of Systems  Positive: Multiple wounds to forearm and hand Negative: No numbness  Physical Exam  BP 139/82 (BP Location: Right Arm)   Pulse (!) 105   Temp 98.6 F (37 C) (Oral)   Resp 16   Ht 5\' 2"  (1.575 m)   Wt 85.7 kg   LMP 04/21/2023 (Approximate)   SpO2 99%   BMI 34.57 kg/m  Gen:   Awake, no distress   Resp:  Normal effort  MSK:   Moves extremities without difficulty pain with range of motion of fingers but is able to freely move fingers, dressings in place currently. Other:  Less than 2-second cap refill in fingertips  Medical Decision Making  Medically screening exam initiated at 3:25 PM.  Appropriate orders placed.  Amber Hurst was informed that the remainder of the evaluation will be completed by another provider, this initial triage assessment does not replace that evaluation, and the importance of remaining in the ED until their evaluation is complete.     Burgess Amor, PA-C 05/21/23 1527

## 2023-05-21 NOTE — Discharge Instructions (Addendum)
 We evaluated you for your dog bite wounds.   We have repaired your wounds with sutures.  Please have these removed in approximately 10 to 14 days.  We have also prescribed you antibiotics as dog bites are high risk for infection.  You can develop an infection even while taking antibiotics.  If you notice any increased redness or swelling, drainage of pus, increasing pain, it is very important to return to the emergency department for reassessment.

## 2023-05-21 NOTE — ED Notes (Signed)
 Pt's lac to right hand and left forearm flushed with sterile water

## 2023-05-21 NOTE — ED Triage Notes (Signed)
 Pt reports dog was in "fight or flight mode" attempting to fight another dog. Pt attempted to separate dogs. Dog has laceration to left hand, puncture wound and laceration to thumb. Pt is very tearful. Dog is completely unvaccinated.

## 2023-05-30 DIAGNOSIS — Z0279 Encounter for issue of other medical certificate: Secondary | ICD-10-CM

## 2023-06-03 ENCOUNTER — Inpatient Hospital Stay: Payer: Self-pay | Admitting: Nurse Practitioner

## 2023-06-06 ENCOUNTER — Encounter: Payer: Self-pay | Admitting: Nurse Practitioner

## 2023-06-06 ENCOUNTER — Ambulatory Visit (INDEPENDENT_AMBULATORY_CARE_PROVIDER_SITE_OTHER): Payer: Self-pay | Admitting: Nurse Practitioner

## 2023-06-06 VITALS — BP 131/79 | HR 82 | Temp 98.3°F | Ht 62.0 in | Wt 209.0 lb

## 2023-06-06 DIAGNOSIS — Z4802 Encounter for removal of sutures: Secondary | ICD-10-CM

## 2023-06-06 NOTE — Progress Notes (Signed)
   Subjective:    Patient ID: Amber Hurst, female    DOB: [birth year], 23 y.o.   MRN: [id number]   Chief Complaint: Suture / Staple Removal   HPI  Patient went to ED with a dog bite to right wrist. She had sutures placed and is here today for suture removal.  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  Eyes:  Negative for pain.  Respiratory:  Negative for shortness of breath.   Cardiovascular:  Negative for chest pain, palpitations and leg swelling.  Gastrointestinal:  Negative for abdominal pain.  Endocrine: Negative for polydipsia.  Skin:  Negative for rash.  Neurological:  Negative for dizziness, weakness and headaches.  Hematological:  Does not bruise/bleed easily.  All other systems reviewed and are negative.      Objective:   Physical Exam Skin:    Comments: Laceration to right wrist- wound edges well approximated with scabbing- 15 stitches 2 lacerations to left forearm healed-- 4 stitches in 1 and 3 stitches in another   Stitch removal- 15 from right wrist and 7 from left forearm Stristrips with benzoin applied to right wrist        Assessment & Plan:  Amber Hurst in today with chief complaint of Suture / Staple Removal   1. Visit for suture removal (Primary) wOund care discussed- hand out given on caring for steri strips. Continue to watch for signs of infection Complete all antibiotics Return to work Monday   The above assessment and management plan was discussed with the patient. The patient verbalized understanding of and has agreed to the management plan. Patient is aware to call the clinic if symptoms persist or worsen. Patient is aware when to return to the clinic for a follow-up visit. Patient educated on when it is appropriate to  go to the emergency department.   Mary-Margaret Daphine Deutscher, FNP

## 2023-06-06 NOTE — Patient Instructions (Signed)
 Sterile Tape Wound Care Some cuts and wounds can be closed using sterile tape, also called skin adhesive strips. You can use skin adhesive strips for shallow (superficial) and simple cuts, wounds, skin tears (lacerations), and some surgical incisions. These strips are used in place of stitches (sutures), or along with sutures, to hold the edges of your wound together and to promote better healing. Unlike sutures, adhesive strips do not require needles or anesthetic medicine for placement. The strips usually fall off on their own as your wound heals. It is important to take proper care of your wound while it heals. Supplies needed: Soap and water. A clean, dry towel. If needed: Wound cleanser or saline solution. Clean gauze. A clean bandage (dressing) or another type of wound dressing may be used to cover or place on your wound. The wound may also be left open to air with no dressing. Follow instructions from your health care provider about what dressing supplies to use. Cream or ointment to apply to your wound, if told by your health care provider. How to care for your sterile tape wound Wound care Try to keep the area around your wound clean and dry. Do not allow the adhesive strips to get wet for the first 12 hours. Do not use any soaps or ointments on the wound for the first 12 hours. Ask your health care provider how to clean your wound. This may include: Using mild soap and water, a wound cleanser, or saline solution. Using a clean, dry towel or clean gauze to pat the wound dry after cleaning it. Do not rub or scrub your wound. Do not scratch, rub, or pick at your wound area. Do not take baths, swim, or use a hot tub until your health care provider approves. Ask your health care provider if you may take showers. You may only be allowed to take sponge baths. Protect your wound from further injury until it is healed. Protect your wound from sun and tanning bed exposure while it is healing,  and for several weeks after healing. Dressing care  If a dressing was put on your wound, follow instructions from your health care provider about how often to change your dressing. Make sure you: Wash your hands with soap and water for at least 20 seconds before and after you change your dressing. If soap and water are not available, use hand sanitizer. Change your dressing as told by your health care provider. Leave adhesive strips in place. These skin closures may need to stay in place for 2 weeks or longer. If adhesive strip edges start to loosen and curl up, you may trim the loose edges. Do not remove adhesive strips completely unless your health care provider tells you to do that. Keep your dressing dry. Ask your health care provider when you can leave your wound uncovered. Checking for infection Check your wound every day for signs of infection. Check for: Redness, swelling, or pain. Fluid or blood. New warmth, a rash, or hardness at the wound site. Pus or a bad smell.  Follow these instructions at home: Medicines Take over-the-counter and prescription medicines only as told by your health care provider. If you were prescribed an antibiotic medicine, take or apply it as told by your health care provider. Do not stop using the antibiotic even if you start to feel better. General instructions Do not use any products that contain nicotine or tobacco. These products include cigarettes, chewing tobacco, and vaping devices, such as e-cigarettes. If you  need help quitting, ask your health care provider. Eat a diet that includes protein, vitamin A, and vitamin C to help the wound heal. Drink enough fluid to keep your urine pale yellow. Keep all follow-up visits. This is important. Contact a health care provider if: Your adhesive strips become soaked with blood or fall off before your wound has healed. The tape will need to be replaced. You have a fever or chills. You have redness, swelling,  or pain around your wound. You have fluid or blood coming from your wound. You have new warmth around your wound. You develop a rash after the strips are applied. You have a hardness around your wound. Get help right away if: You have any of these signs of severe infection: A red streak that goes away from your wound. Pus or a bad smell coming from your wound. Your wound opens or gets deeper, longer, or wider. Summary Some cuts and wounds can be closed using sterile tape, or skin adhesive strips, without the need for sutures. The strips usually fall off on their own as your wound is healing. It is important to take proper care of your wound at home while it heals and to clean it as told by your health care provider. To help with healing, eat foods that are rich in protein, vitamin A, and vitamin C. This information is not intended to replace advice given to you by your health care provider. Make sure you discuss any questions you have with your health care provider. Document Revised: 07/24/2020 Document Reviewed: 07/24/2020 Elsevier Patient Education  2024 [employer].

## 2023-06-09 ENCOUNTER — Encounter: Payer: Self-pay | Admitting: Nurse Practitioner

## 2023-06-09 ENCOUNTER — Telehealth: Payer: Self-pay

## 2023-06-09 NOTE — Telephone Encounter (Unsigned)
 Copied from CRM [id number]. Topic: Clinical - Medical Advice >> Jun 09, 2023  9:32 AM Gaetano Hawthorne wrote: Reason for CRM: Patient is calling for two reasons - 1) Patient attempted to return to work (she recently got her stitches out due to an animal bite) today, however, she was asked to obtain a letter for an extended leave of absence from her workplace - Patient works with her hands and has difficulty writing things down with her right hand - workplace is asking for a letter extending her leave for another week (next Monday). If this letter can be provided via MyChart for the patient that would be great as she will need to forward that to HR.   2) Patient finished her antibiotic that she was provided last Wednesday- should she be taking anything else or just watch out for any signs of inflection?  Please call the patient when you have a moment.

## 2023-06-16 ENCOUNTER — Telehealth: Payer: Self-pay | Admitting: Nurse Practitioner

## 2023-06-16 DIAGNOSIS — Z0279 Encounter for issue of other medical certificate: Secondary | ICD-10-CM

## 2023-06-16 NOTE — Telephone Encounter (Signed)
 [employer] mailed  disability forms to be completed and signed.  Form Fee Paid? (Y/N)       y     If NO, form is placed on front office manager desk to hold until payment received. If YES, then form will be placed in the RX/HH Nurse Coordinators box for completion.  Form will not be processed until payment is received

## 2023-06-16 NOTE — Telephone Encounter (Signed)
Please make patient an appointment 

## 2023-06-17 ENCOUNTER — Telehealth: Payer: Self-pay | Admitting: Nurse Practitioner

## 2023-06-17 NOTE — Telephone Encounter (Signed)
 Called patient to see if she wanted to schedule appt to look at her hand because she thought it may be infected.  Patient states it is better and does not need appointment, but will call back if needed.

## 2023-06-18 ENCOUNTER — Encounter: Payer: Self-pay | Admitting: Family Medicine

## 2023-06-18 ENCOUNTER — Ambulatory Visit (INDEPENDENT_AMBULATORY_CARE_PROVIDER_SITE_OTHER): Payer: Self-pay | Admitting: Family Medicine

## 2023-06-18 VITALS — BP 131/81 | HR 92 | Temp 98.1°F | Ht 62.0 in | Wt 210.0 lb

## 2023-06-18 DIAGNOSIS — Z0279 Encounter for issue of other medical certificate: Secondary | ICD-10-CM

## 2023-06-18 DIAGNOSIS — W540XXD Bitten by dog, subsequent encounter: Secondary | ICD-10-CM

## 2023-06-18 DIAGNOSIS — S41151D Open bite of right upper arm, subsequent encounter: Secondary | ICD-10-CM

## 2023-06-18 MED ORDER — CEPHALEXIN 500 MG PO CAPS: 500.0000 mg | ORAL_CAPSULE | Freq: Four times a day (QID) | ORAL | 0 refills | Status: AC

## 2023-06-18 NOTE — Telephone Encounter (Signed)
 [employer] faxed FMLA forms to be completed  Form Fee Paid? (Y/N)       YES     If NO, form is placed on front office manager desk to hold until payment received. If YES, then form will be placed in the RX/HH Nurse Coordinators box for completion.  Form will not be processed until payment is received

## 2023-06-18 NOTE — Progress Notes (Signed)
 Subjective:  Patient ID: Amber Hurst, female    DOB: 07-23-2000, 23 y.o.   MRN: [id number]  Patient Care Team: Bennie Pierini, FNP as PCP - General (Nurse Practitioner)   Chief Complaint:  Animal Bite (Right wrist )   HPI: Amber Hurst is a 23 y.o. female presenting on 06/18/2023 for Animal Bite (Right wrist )   Discussed the use of AI scribe software for clinical note transcription with the patient, who gave verbal consent to proceed.  History of Present Illness   Amber Hurst "Amber Hurst" is a 23 year old female who presents with a dog bite on her hand. Her stepmother is concerned about potential long-term issues with her hand.  The dog bite on her hand is improving but still causes pain, particularly in the fingers. The wound occasionally reopens, with clear fluid and blood drainage, but there is no significant pus, fever, or warmth indicating infection. She has been taking doxycycline and was prescribed Percocet for pain management. She does not recall being prescribed Augmentin or Keflex initially.  The injury involved missing tissue and is healing by secondary intention, leading to a prolonged healing process. She avoids lifting or heavy use of the hand to prevent the wound from reopening. She uses 'hand socks' to protect the wound, but they have become soiled, and she requires new ones. She washes the wound with soap and water twice daily and applies Scar Gel to the healing areas. She is currently on medical leave and is concerned about returning to work before the wound is fully healed.  Her stepmother is concerned about potential long-term issues with her hand, such as weakness and shaking, and has suggested seeing an orthopedic specialist. She can move her fingers and does not exhibit signs of nerve damage. However, she notes difficulty in getting a strong grip, particularly with her thumb, which was also affected by the bite.  No fever or significant pus drainage from the  wound. No warmth or heat at the site of the injury.          Relevant past medical, surgical, family, and social history reviewed and updated as indicated.  Allergies and medications reviewed and updated. Data reviewed: Chart in Epic.   Past Medical History:  Diagnosis Date   ADD (attention deficit disorder)    Environmental allergies    respiratory     History reviewed. No pertinent surgical history.  Social History   Socioeconomic History   Marital status: Significant Other    Spouse name: Not on file   Number of children: Not on file   Years of education: Not on file   Highest education level: Not on file  Occupational History   Not on file  Tobacco Use   Smoking status: Never   Smokeless tobacco: Never  Vaping Use   Vaping status: Every Day   Substances: Nicotine, Flavoring  Substance and Sexual Activity   Alcohol use: Yes   Drug use: No   Sexual activity: Yes    Birth control/protection: None  Other Topics Concern   Not on file  Social History Narrative   Not on file   Social Drivers of Health   Financial Resource Strain: Low Risk  (01/31/2022)   Overall Financial Resource Strain (CARDIA)    Difficulty of Paying Living Expenses: Not very hard  Food Insecurity: No Food Insecurity (01/31/2022)   Hunger Vital Sign    Worried About Running Out of Food in the Last Year: Never true  Ran Out of Food in the Last Year: Never true  Transportation Needs: No Transportation Needs (01/31/2022)   PRAPARE - Administrator, Civil Service (Medical): No    Lack of Transportation (Non-Medical): No  Physical Activity: Insufficiently Active (06/06/2020)   Exercise Vital Sign    Days of Exercise per Week: 2 days    Minutes of Exercise per Session: 10 min  Stress: No Stress Concern Present (06/06/2020)   [employer] of Occupational Health - Occupational Stress Questionnaire    Feeling of Stress : Only a little  Social Connections: Unknown (08/13/2021)    Received from [facility], [facility]   Social Network    Social Network: Not on file  Intimate Partner Violence: Unknown (07/05/2021)   Received from [facility], [facility]   HITS    Physically Hurt: Not on file    Insult or Talk Down To: Not on file    Threaten Physical Harm: Not on file    Scream or Curse: Not on file    Outpatient Encounter Medications as of 06/18/2023  Medication Sig   cephALEXin (KEFLEX) 500 MG capsule Take 1 capsule (500 mg total) by mouth 4 (four) times daily for 7 days.   ibuprofen (ADVIL) 600 MG tablet Take 1 tablet (600 mg total) by mouth every 6 (six) hours as needed.   oxyCODONE-acetaminophen (PERCOCET/ROXICET) 5-325 MG tablet Take 1 tablet by mouth every 6 (six) hours as needed for severe pain (pain score 7-10).   VENTOLIN HFA 108 (90 Base) MCG/ACT inhaler Inhale 2 puffs into the lungs every 6 (six) hours as needed for wheezing or shortness of breath.   No facility-administered encounter medications on file as of 06/18/2023.    Allergies  Allergen Reactions   Amoxicillin Itching   Chocolate Flavoring Agent (Non-Screening)     Rash with itching    Pertinent ROS per HPI, otherwise unremarkable      Objective:  BP 131/81   Pulse 92   Temp 98.1 F (36.7 C)   Ht 5\' 2"  (1.575 m)   Wt 210 lb (95.3 kg)   LMP 04/21/2023 (Approximate)   SpO2 99%   BMI 38.41 kg/m    Wt Readings from Last 3 Encounters:  06/18/23 210 lb (95.3 kg)  06/06/23 209 lb (94.8 kg)  05/21/23 189 lb (85.7 kg)    Physical Exam Vitals and nursing note reviewed.  Constitutional:      General: She is not in acute distress.    Appearance: Normal appearance. She is obese. She is not ill-appearing, toxic-appearing or diaphoretic.  HENT:     Head: Normocephalic and atraumatic.     Mouth/Throat:     Mouth: Mucous membranes are moist.  Eyes:     Conjunctiva/sclera: Conjunctivae normal.     Pupils: Pupils are equal, round, and reactive to light.  Cardiovascular:      Rate and Rhythm: Normal rate and regular rhythm.     Pulses: Normal pulses.     Heart sounds: Normal heart sounds.  Pulmonary:     Effort: Pulmonary effort is normal.     Breath sounds: Normal breath sounds.  Musculoskeletal:        General: Normal range of motion.  Skin:    General: Skin is warm and dry.     Capillary Refill: Capillary refill takes less than 2 seconds.     Findings: Wound present.     Comments: Wound to right wrist/palm of hand. Image below. Slight erythema and  tenderness. Able to make a fist and move all fingers.   Neurological:     General: No focal deficit present.     Mental Status: She is alert and oriented to person, place, and time.  Psychiatric:        Mood and Affect: Mood normal.        Behavior: Behavior normal.        Thought Content: Thought content normal.        Judgment: Judgment normal.      Results for orders placed or performed during the hospital encounter of 05/21/23  POC Urine Pregnancy, ED (not at [facility] or [facility])   Collection Time: 05/21/23  6:24 PM  Result Value Ref Range   Preg Test, Ur Negative Negative       Pertinent labs & imaging results that were available during my care of the patient were reviewed by me and considered in my medical decision making.  Assessment & Plan:  Amber Hurst "Amber Hurst" was seen today for animal bite.  Diagnoses and all orders for this visit:  Dog bite of right upper extremity, subsequent encounter -     cephALEXin (KEFLEX) 500 MG capsule; Take 1 capsule (500 mg total) by mouth 4 (four) times daily for 7 days.     Assessment and Plan    Dog bite wound She has a dog bite wound on her hand, healing by secondary intention due to missing tissue. Occasional reopening due to movement prolongs healing. She is on medical leave until full healing to prevent infection spread to joint or tendon. Augmentin is prescribed for broader antibiotic coverage as she was initially only on doxycycline. - Prescribe Augmentin  for broader antibiotic coverage - Instruct to wash the wound with soap and water twice daily, avoiding ointments, and pat dry - Advise to keep the wound protected with a covering - Provide protective sleeves for the wound  Hand weakness She reports weakness in her hand, particularly in the thumb, following the dog bite. There is no indication of nerve damage, and the weakness is likely due to the recent injury and lack of use. She is able to move her fingers and grip objects, albeit with reduced strength. A follow-up is planned to reassess hand function and consider referral to orthopedics if no improvement is observed. - Schedule follow-up appointment in a week to reassess hand function - Consider referral to orthopedics if no improvement in hand function is observed at follow-up          Continue all other maintenance medications.  Follow up plan: Return in 1 week (on 06/25/2023), or if symptoms worsen or fail to improve, for Wound Recheck.   Continue healthy lifestyle choices, including diet (rich in fruits, vegetables, and lean proteins, and low in salt and simple carbohydrates) and exercise (at least 30 minutes of moderate physical activity daily).  Educational handout given for wound care  The above assessment and management plan was discussed with the patient. The patient verbalized understanding of and has agreed to the management plan. Patient is aware to call the clinic if they develop any new symptoms or if symptoms persist or worsen. Patient is aware when to return to the clinic for a follow-up visit. Patient educated on when it is appropriate to go to the emergency department.   Kari Baars, FNP-C [facility] [phone]

## 2023-06-26 ENCOUNTER — Other Ambulatory Visit: Payer: Self-pay | Admitting: Nurse Practitioner

## 2023-06-26 DIAGNOSIS — W540XXD Bitten by dog, subsequent encounter: Secondary | ICD-10-CM

## 2023-06-27 ENCOUNTER — Encounter: Payer: Self-pay | Admitting: Family Medicine

## 2023-06-27 ENCOUNTER — Ambulatory Visit: Payer: Self-pay

## 2023-06-27 VITALS — BP 137/88 | HR 99 | Temp 98.7°F | Ht 62.0 in

## 2023-06-27 DIAGNOSIS — W540XXD Bitten by dog, subsequent encounter: Secondary | ICD-10-CM

## 2023-06-27 DIAGNOSIS — S41151D Open bite of right upper arm, subsequent encounter: Secondary | ICD-10-CM

## 2023-06-27 MED ORDER — DOXYCYCLINE HYCLATE 100 MG PO TABS
100.0000 mg | ORAL_TABLET | Freq: Two times a day (BID) | ORAL | 0 refills | Status: DC
Start: 2023-06-27 — End: 2023-06-27

## 2023-06-27 MED ORDER — DOXYCYCLINE HYCLATE 100 MG PO TABS: 100.0000 mg | ORAL_TABLET | Freq: Two times a day (BID) | ORAL | 0 refills | Status: DC

## 2023-06-27 MED ORDER — DOXYCYCLINE HYCLATE 100 MG PO TABS: 100.0000 mg | ORAL_TABLET | Freq: Two times a day (BID) | ORAL | 0 refills | Status: AC

## 2023-06-27 NOTE — Progress Notes (Signed)
 Subjective: CC: Hand pain PCP: Bennie Pierini, FNP ZOX:[id number] Amber Hurst is Amber 23 y.o. female presenting to clinic today for:  1.  Wound of hand Patient was bitten by dog May 21, 2023.  She was seen in the ER for this and suture placed.  On 06/06/2023 she had sutures removed.  She reports that her wound subsequently broke open and Steri-Strips were placed.  She was seen again on 06/18/2023 and placed on oral antibiotics.  Today she presents because the hand has continued to seem worse, it continues to drain, she has not been able to use the right hand due to fear of it breaking back open.  She notes numbness in the hand.   ROS: Per HPI  Allergies  Allergen Reactions   Amoxicillin Itching   Chocolate Flavoring Agent (Non-Screening)     Rash with itching   Past Medical History:  Diagnosis Date   ADD (attention deficit disorder)    Environmental allergies    respiratory     Current Outpatient Medications:    ibuprofen (ADVIL) 600 MG tablet, Take 1 tablet (600 mg total) by mouth every 6 (six) hours as needed., Disp: 30 tablet, Rfl: 0   VENTOLIN HFA 108 (90 Base) MCG/ACT inhaler, Inhale 2 puffs into the lungs every 6 (six) hours as needed for wheezing or shortness of breath., Disp: 18 g, Rfl: 0 Social History   Socioeconomic History   Marital status: Significant Other    Spouse name: Not on file   Number of children: Not on file   Years of education: Not on file   Highest education level: Not on file  Occupational History   Not on file  Tobacco Use   Smoking status: Never   Smokeless tobacco: Never  Vaping Use   Vaping status: Every Day   Substances: Nicotine, Flavoring  Substance and Sexual Activity   Alcohol use: Yes   Drug use: No   Sexual activity: Yes    Birth control/protection: None  Other Topics Concern   Not on file  Social History Narrative   Not on file   Social Drivers of Health   Financial Resource Strain: Low Risk  (01/31/2022)   Overall  Financial Resource Strain (CARDIA)    Difficulty of Paying Living Expenses: Not very hard  Food Insecurity: No Food Insecurity (01/31/2022)   Hunger Vital Sign    Worried About Running Out of Food in the Last Year: Never true    Ran Out of Food in the Last Year: Never true  Transportation Needs: No Transportation Needs (01/31/2022)   PRAPARE - Administrator, Civil Service (Medical): No    Lack of Transportation (Non-Medical): No  Physical Activity: Insufficiently Active (06/06/2020)   Exercise Vital Sign    Days of Exercise per Week: 2 days    Minutes of Exercise per Session: 10 min  Stress: No Stress Concern Present (06/06/2020)   [employer] of Occupational Health - Occupational Stress Questionnaire    Feeling of Stress : Only Amber little  Social Connections: Unknown (08/13/2021)   Received from [facility], [facility]   Social Network    Social Network: Not on file  Intimate Partner Violence: Unknown (07/05/2021)   Received from [facility], [facility]   HITS    Physically Hurt: Not on file    Insult or Talk Down To: Not on file    Threaten Physical Harm: Not on file    Scream or Curse: Not on file  Family History  Problem Relation Age of Onset   Hypertension Father     Objective: Office vital signs reviewed. BP 137/88   Pulse 99   Temp 98.7 F (37.1 C)   Ht 5\' 2"  (1.575 m)   LMP 06/19/2023   SpO2 98%   BMI 38.41 kg/m   Physical Examination:  General: Awake, alert, well nourished, No acute distress         Assessment/ Plan: 23 y.o. female   Dog bite of right upper extremity, subsequent encounter - Plan: Ambulatory referral to Hand Surgery, doxycycline (VIBRA-TABS) 100 MG tablet, DISCONTINUED: doxycycline (VIBRA-TABS) 100 MG tablet, DISCONTINUED: doxycycline (VIBRA-TABS) 100 MG tablet  Sent referral to hand surgery.  I wonder if she may be needs debridement of the wound.  She had quite Amber bit of eschar with scant nonpurulent appearing  drainage.  I did clean the wound and redress it with Steri-Strips just to keep it approximated since it was continuing to split open.  I discussed the importance of not utilizing that hand to allow for healing though she has had quite Amber bit of trouble doing that.   Raliegh Ip, DO [facility] [phone]

## 2023-06-27 NOTE — Patient Instructions (Signed)
 Amber Hurst  [facility] 4.959 [employer] reviews   Located in: [facility] Address: [address], [place], [place] [address] Phone: [phone]

## 2023-06-30 ENCOUNTER — Telehealth: Payer: Self-pay

## 2023-06-30 NOTE — Telephone Encounter (Signed)
 Copied from CRM [id number]. Topic: Referral - Question >> Jun 30, 2023 11:11 AM Clayton Bibles wrote: Reason for CRM:  Gerline was suppose to see [facility] in [place] on 06/27/23 and there was not a referral sent to Ortho. Manjot call back this morning and Ortho told her no referral was sent. I tried to call office and no answer. Doristine would like a call back as soon as possible at [phone]. She was told by Dr. Nadine Counts that she needs to see Ortho as soon as possible. Thanks

## 2023-06-30 NOTE — Telephone Encounter (Signed)
 Please see referral note. The note indicates that she was agreeable to walk in clinic at [place].  Her referral is in the EMR. Will cc Courtney on this as well.

## 2023-06-30 NOTE — Telephone Encounter (Signed)
**Note De-identified  [place] Obfuscation** Please advise 

## 2023-07-01 ENCOUNTER — Ambulatory Visit: Payer: Self-pay | Admitting: Nurse Practitioner

## 2023-07-02 ENCOUNTER — Other Ambulatory Visit: Payer: Self-pay | Admitting: Orthopedic Surgery

## 2023-07-02 DIAGNOSIS — M79641 Pain in right hand: Secondary | ICD-10-CM

## 2023-07-06 ENCOUNTER — Other Ambulatory Visit: Payer: Self-pay

## 2023-07-08 ENCOUNTER — Ambulatory Visit
Admission: RE | Admit: 2023-07-08 | Discharge: 2023-07-08 | Disposition: A | Discharge status: Home / Self Care | Payer: Self-pay | Source: Ambulatory Visit | Attending: Orthopedic Surgery

## 2023-07-08 DIAGNOSIS — M79641 Pain in right hand: Secondary | ICD-10-CM

## 2023-07-10 DIAGNOSIS — S61451D Open bite of right hand, subsequent encounter: Secondary | ICD-10-CM | POA: Diagnosis not present

## 2023-08-15 ENCOUNTER — Ambulatory Visit: Payer: Self-pay | Admitting: Nurse Practitioner

## 2024-01-23 ENCOUNTER — Ambulatory Visit: Admitting: Nurse Practitioner

## 2024-01-23 VITALS — BP 142/85 | HR 86 | Temp 98.1°F | Ht 62.0 in | Wt 222.0 lb

## 2024-01-23 DIAGNOSIS — J392 Other diseases of pharynx: Secondary | ICD-10-CM

## 2024-01-23 MED ORDER — OMEPRAZOLE 40 MG PO CPDR: 40.0000 mg | DELAYED_RELEASE_CAPSULE | Freq: Every day | ORAL | 3 refills | Status: AC

## 2024-01-23 NOTE — Progress Notes (Signed)
   Subjective:    Patient ID: Amber Hurst, female    DOB: January 01, 2001, 23 y.o.   MRN: [id number]   Chief Complaint: bumps in back of throat   HPI  Patient comes in stating that she feels like she has a hairball in the back of her throat. No trouble breathing. She has tried everything to make this feeling go away. Gargled with salt water.  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  Eyes:  Negative for pain.  Respiratory:  Negative for shortness of breath.   Cardiovascular:  Negative for chest pain, palpitations and leg swelling.  Gastrointestinal:  Negative for abdominal pain.  Endocrine: Negative for polydipsia.  Skin:  Negative for rash.  Neurological:  Negative for dizziness, weakness and headaches.  Hematological:  Does not bruise/bleed easily.  All other systems reviewed and are negative.      Objective:   Physical Exam Constitutional:      Appearance: Normal appearance. She is obese.  HENT:     Right Ear: Tympanic membrane normal.     Left Ear: Tympanic membrane normal.  Cardiovascular:     Rate and Rhythm: Normal rate and regular rhythm.     Heart sounds: Normal heart sounds.  Pulmonary:     Breath sounds: Normal breath sounds.  Skin:    General: Skin is warm.  Neurological:     General: No focal deficit present.     Mental Status: She is alert and oriented to person, place, and time.  Psychiatric:        Mood and Affect: Mood normal.        Behavior: Behavior normal.    BP (!) 142/85   Pulse 86   Temp 98.1 F (36.7 C) (Temporal)   Ht 5' 2 (1.575 m)   Wt 222 lb (100.7 kg)   SpO2 98%   BMI 40.60 kg/m         Assessment & Plan:   Amber Hurst in today with chief complaint of bumps in back of throat   1. Throat  irritation (Primary) Possible GERD Avoid spicy and fatty foods If resolve with omeprazole  then can cancel GI appointment - omeprazole  (PRILOSEC) 40 MG capsule; Take 1 capsule (40 mg total) by mouth daily.  Dispense: 30 capsule; Refill: 3 - Ambulatory referral to Gastroenterology    The above assessment and management plan was discussed with the patient. The patient verbalized understanding of and has agreed to the management plan. Patient is aware to call the clinic if symptoms persist or worsen. Patient is aware when to return to the clinic for a follow-up visit. Patient educated on when it is appropriate to go to the emergency department.   Mary-Margaret Gladis, FNP

## 2024-01-23 NOTE — Patient Instructions (Signed)
 GERD in Adults: Diet Changes When you have gastroesophageal reflux disease (GERD), you may need to make changes to your diet. Choosing the right foods can help with your symptoms. Think about working with an expert in healthy eating called a dietitian. They can help you make healthy food choices. What are tips for following this plan? Reading food labels Look for foods that are low in saturated fat. Foods that may help with your symptoms include: Foods with less than 5% of daily value (DV) of fat. Foods with 0 grams of trans fat. Cooking [employer] in ways that don't use a lot of fat. These ways include: Baking. Steaming. Grilling. Broiling. To add flavor, try to use herbs that are low in spice and acidity. Avoid frying your food. Meal planning  Eat small meals often rather than eating 3 large meals each day. Eat your meals slowly in a place where you feel relaxed. If told by your health care provider, avoid: Foods that cause symptoms. Keep a food diary to keep track of foods that cause symptoms. Alcohol. Drinking a lot of liquid with meals. General instructions For 2-3 hours after you eat, avoid: Bending over. Exercise. Lying down. Chew sugar-free gum after meals. What foods should I eat? Eat a healthy diet. Try to include: Foods with high amounts of fiber. These include: Fruits and vegetables. Whole grains and beans. Low-fat dairy products. Lean meats, fish, and poultry. Egg whites. Foods that cause symptoms in someone else may not cause symptoms for you. Work with your provider to find foods that are safe for you. The items listed above may not be all the foods and drinks you can have. Talk with a dietitian to learn more. The items listed above may not be a complete list of foods and beverages you can eat and drink. Contact a dietitian for more information. What foods should I avoid? Limiting some of these foods may help with your symptoms. Each person is different.  Talk with a dietitian or your provider to help you find the exact foods to avoid. Some of the foods to avoid may include: Fruits Fruits with a lot of acid in them. These may include citrus fruits, such as oranges, grapefruit, pineapple, and lemons. Vegetables Deep-fried vegetables, such as [place] fries. Vegetables, sauces, or toppings made with added fat and vegetables with acid in them. These may include tomatoes and tomato products, chili peppers, onions, garlic, and horseradish. Grains Pastries or quick breads with added fat. Meats and other proteins High-fat meats, such as fatty beef or pork, hot dogs, ribs, ham, sausage, salami, and bacon. Fried meat or protein, such as fried fish and fried chicken. Egg yolks. Fats and oils Butter. Margarine. Shortening. Ghee. Drinks Coffee and other drinks with caffeine in them. Fizzy and sugary drinks, such as soda and energy drinks. Fruit juice made with acidic fruits, such as orange or grapefruit. Tomato juice. Sweets and desserts Chocolate and cocoa. Donuts. Seasonings and condiments Mint, such as peppermint and spearmint. Condiments, herbs, or seasonings that cause symptoms. These may include curry, hot sauce, or vinegar-based salad dressings. The items listed above may not be all the foods and drinks you should avoid. Talk with a dietitian to learn more. Questions to ask your health care provider Changes to your diet and everyday life are often the first steps taken to manage symptoms of GERD. If these changes don't help, talk with your provider about taking medicines. Where to find more information [employer]:  aboutgerd.org This information is not intended to replace advice given to you by your health care provider. Make sure you discuss any questions you have with your health care provider. Document Revised: 01/28/2023 Document Reviewed: 08/14/2022 Elsevier Patient Education  2024 [employer].

## 2024-04-19 ENCOUNTER — Encounter (INDEPENDENT_AMBULATORY_CARE_PROVIDER_SITE_OTHER): Payer: Self-pay | Admitting: *Deleted

## 2024-05-06 ENCOUNTER — Ambulatory Visit: Admitting: Internal Medicine
